# Patient Record
Sex: Male | Born: 1970
Health system: Southern US, Community
[De-identification: ages and names within clinical notes are randomized; demographics above are authoritative.]

## PROBLEM LIST (undated history)

## (undated) DIAGNOSIS — I10 Essential (primary) hypertension: Secondary | ICD-10-CM

## (undated) DIAGNOSIS — D510 Vitamin B12 deficiency anemia due to intrinsic factor deficiency: Secondary | ICD-10-CM

## (undated) DIAGNOSIS — R269 Unspecified abnormalities of gait and mobility: Secondary | ICD-10-CM

## (undated) HISTORY — DX: Unspecified abnormalities of gait and mobility: R26.9

## (undated) HISTORY — PX: CHOLECYSTECTOMY: SHX55

## (undated) HISTORY — PX: TOE AMPUTATION: SHX809

## (undated) HISTORY — DX: Vitamin B12 deficiency anemia due to intrinsic factor deficiency: D51.0

---

## 1999-03-08 ENCOUNTER — Emergency Department (HOSPITAL_COMMUNITY): Admission: EM | Admit: 1999-03-08 | Discharge: 1999-03-08 | Payer: Self-pay

## 1999-10-09 ENCOUNTER — Encounter: Payer: Self-pay | Admitting: Internal Medicine

## 1999-10-09 ENCOUNTER — Inpatient Hospital Stay (HOSPITAL_COMMUNITY): Admission: EM | Admit: 1999-10-09 | Discharge: 1999-10-10 | Payer: Self-pay | Admitting: Emergency Medicine

## 2001-06-04 ENCOUNTER — Inpatient Hospital Stay (HOSPITAL_COMMUNITY): Admission: EM | Admit: 2001-06-04 | Discharge: 2001-06-11 | Payer: Self-pay | Admitting: *Deleted

## 2001-06-04 ENCOUNTER — Encounter (INDEPENDENT_AMBULATORY_CARE_PROVIDER_SITE_OTHER): Payer: Self-pay | Admitting: Specialist

## 2001-06-04 ENCOUNTER — Encounter: Payer: Self-pay | Admitting: Internal Medicine

## 2001-06-06 ENCOUNTER — Encounter: Payer: Self-pay | Admitting: Internal Medicine

## 2001-06-18 ENCOUNTER — Encounter: Admission: RE | Admit: 2001-06-18 | Discharge: 2001-06-26 | Payer: Self-pay | Admitting: Internal Medicine

## 2001-07-17 ENCOUNTER — Encounter (HOSPITAL_BASED_OUTPATIENT_CLINIC_OR_DEPARTMENT_OTHER): Admission: RE | Admit: 2001-07-17 | Discharge: 2001-08-30 | Payer: Self-pay | Admitting: Orthopedic Surgery

## 2001-09-09 ENCOUNTER — Emergency Department (HOSPITAL_COMMUNITY): Admission: EM | Admit: 2001-09-09 | Discharge: 2001-09-09 | Payer: Self-pay | Admitting: *Deleted

## 2003-01-22 ENCOUNTER — Emergency Department (HOSPITAL_COMMUNITY): Admission: EM | Admit: 2003-01-22 | Discharge: 2003-01-22 | Payer: Self-pay | Admitting: Emergency Medicine

## 2003-08-14 ENCOUNTER — Emergency Department (HOSPITAL_COMMUNITY): Admission: EM | Admit: 2003-08-14 | Discharge: 2003-08-14 | Payer: Self-pay | Admitting: Family Medicine

## 2003-11-15 ENCOUNTER — Emergency Department (HOSPITAL_COMMUNITY): Admission: EM | Admit: 2003-11-15 | Discharge: 2003-11-15 | Payer: Self-pay | Admitting: Emergency Medicine

## 2003-11-20 ENCOUNTER — Emergency Department (HOSPITAL_COMMUNITY): Admission: EM | Admit: 2003-11-20 | Discharge: 2003-11-20 | Payer: Self-pay | Admitting: Emergency Medicine

## 2003-11-23 ENCOUNTER — Inpatient Hospital Stay (HOSPITAL_COMMUNITY): Admission: EM | Admit: 2003-11-23 | Discharge: 2003-12-01 | Payer: Self-pay

## 2003-11-24 ENCOUNTER — Encounter (INDEPENDENT_AMBULATORY_CARE_PROVIDER_SITE_OTHER): Payer: Self-pay | Admitting: *Deleted

## 2003-12-08 ENCOUNTER — Encounter (HOSPITAL_BASED_OUTPATIENT_CLINIC_OR_DEPARTMENT_OTHER): Admission: RE | Admit: 2003-12-08 | Discharge: 2004-03-07 | Payer: Self-pay | Admitting: Internal Medicine

## 2003-12-17 ENCOUNTER — Encounter: Admission: RE | Admit: 2003-12-17 | Discharge: 2003-12-17 | Payer: Self-pay | Admitting: Orthopedic Surgery

## 2004-01-15 ENCOUNTER — Ambulatory Visit (HOSPITAL_COMMUNITY): Admission: RE | Admit: 2004-01-15 | Discharge: 2004-01-15 | Payer: Self-pay | Admitting: Family Medicine

## 2004-01-15 ENCOUNTER — Emergency Department (HOSPITAL_COMMUNITY): Admission: EM | Admit: 2004-01-15 | Discharge: 2004-01-15 | Payer: Self-pay | Admitting: Family Medicine

## 2004-01-21 ENCOUNTER — Inpatient Hospital Stay (HOSPITAL_COMMUNITY): Admission: RE | Admit: 2004-01-21 | Discharge: 2004-01-23 | Payer: Self-pay | Admitting: Orthopedic Surgery

## 2004-01-21 ENCOUNTER — Encounter (INDEPENDENT_AMBULATORY_CARE_PROVIDER_SITE_OTHER): Payer: Self-pay | Admitting: Specialist

## 2004-05-13 ENCOUNTER — Ambulatory Visit (HOSPITAL_COMMUNITY): Admission: RE | Admit: 2004-05-13 | Discharge: 2004-05-13 | Payer: Self-pay | Admitting: Family Medicine

## 2004-05-24 ENCOUNTER — Encounter: Admission: RE | Admit: 2004-05-24 | Discharge: 2004-05-24 | Payer: Self-pay | Admitting: Gastroenterology

## 2004-09-23 ENCOUNTER — Encounter: Admission: RE | Admit: 2004-09-23 | Discharge: 2004-09-23 | Payer: Self-pay | Admitting: Occupational Medicine

## 2004-11-16 ENCOUNTER — Inpatient Hospital Stay (HOSPITAL_COMMUNITY): Admission: EM | Admit: 2004-11-16 | Discharge: 2004-11-20 | Payer: Self-pay | Admitting: Emergency Medicine

## 2005-07-12 ENCOUNTER — Emergency Department (HOSPITAL_COMMUNITY): Admission: EM | Admit: 2005-07-12 | Discharge: 2005-07-12 | Payer: Self-pay | Admitting: Emergency Medicine

## 2005-07-14 ENCOUNTER — Ambulatory Visit (HOSPITAL_COMMUNITY): Admission: RE | Admit: 2005-07-14 | Discharge: 2005-07-14 | Payer: Self-pay | Admitting: Orthopaedic Surgery

## 2005-07-14 ENCOUNTER — Encounter (INDEPENDENT_AMBULATORY_CARE_PROVIDER_SITE_OTHER): Payer: Self-pay | Admitting: *Deleted

## 2006-01-11 ENCOUNTER — Emergency Department (HOSPITAL_COMMUNITY): Admission: EM | Admit: 2006-01-11 | Discharge: 2006-01-11 | Payer: Self-pay | Admitting: Emergency Medicine

## 2006-01-14 ENCOUNTER — Emergency Department (HOSPITAL_COMMUNITY): Admission: EM | Admit: 2006-01-14 | Discharge: 2006-01-14 | Payer: Self-pay | Admitting: Emergency Medicine

## 2006-10-19 ENCOUNTER — Emergency Department (HOSPITAL_COMMUNITY): Admission: EM | Admit: 2006-10-19 | Discharge: 2006-10-20 | Payer: Self-pay | Admitting: Emergency Medicine

## 2007-07-19 ENCOUNTER — Ambulatory Visit: Payer: Self-pay | Admitting: Family Medicine

## 2007-07-19 ENCOUNTER — Inpatient Hospital Stay (HOSPITAL_COMMUNITY): Admission: AD | Admit: 2007-07-19 | Discharge: 2007-07-25 | Payer: Self-pay | Admitting: Family Medicine

## 2007-07-23 ENCOUNTER — Encounter (INDEPENDENT_AMBULATORY_CARE_PROVIDER_SITE_OTHER): Payer: Self-pay | Admitting: General Surgery

## 2007-11-14 ENCOUNTER — Inpatient Hospital Stay (HOSPITAL_COMMUNITY): Admission: EM | Admit: 2007-11-14 | Discharge: 2007-11-18 | Payer: Self-pay | Admitting: Emergency Medicine

## 2007-11-26 ENCOUNTER — Ambulatory Visit: Payer: Self-pay | Admitting: *Deleted

## 2008-06-29 ENCOUNTER — Emergency Department (HOSPITAL_COMMUNITY): Admission: EM | Admit: 2008-06-29 | Discharge: 2008-06-29 | Payer: Self-pay | Admitting: Emergency Medicine

## 2008-09-19 ENCOUNTER — Inpatient Hospital Stay (HOSPITAL_COMMUNITY): Admission: EM | Admit: 2008-09-19 | Discharge: 2008-09-23 | Payer: Self-pay | Admitting: Emergency Medicine

## 2008-11-18 ENCOUNTER — Ambulatory Visit (HOSPITAL_COMMUNITY): Admission: RE | Admit: 2008-11-18 | Discharge: 2008-11-19 | Payer: Self-pay | Admitting: Orthopedic Surgery

## 2008-12-16 ENCOUNTER — Inpatient Hospital Stay (HOSPITAL_COMMUNITY): Admission: EM | Admit: 2008-12-16 | Discharge: 2008-12-19 | Payer: Self-pay | Admitting: Emergency Medicine

## 2008-12-18 ENCOUNTER — Ambulatory Visit: Payer: Self-pay | Admitting: Cardiology

## 2008-12-18 ENCOUNTER — Encounter: Payer: Self-pay | Admitting: Cardiology

## 2009-03-10 ENCOUNTER — Emergency Department (HOSPITAL_COMMUNITY): Admission: EM | Admit: 2009-03-10 | Discharge: 2009-03-10 | Payer: Self-pay | Admitting: Emergency Medicine

## 2009-03-22 ENCOUNTER — Emergency Department (HOSPITAL_COMMUNITY): Admission: EM | Admit: 2009-03-22 | Discharge: 2009-03-23 | Payer: Self-pay | Admitting: Emergency Medicine

## 2009-04-29 ENCOUNTER — Emergency Department (HOSPITAL_COMMUNITY): Admission: EM | Admit: 2009-04-29 | Discharge: 2009-04-29 | Payer: Self-pay | Admitting: Emergency Medicine

## 2009-05-02 ENCOUNTER — Emergency Department (HOSPITAL_COMMUNITY): Admission: EM | Admit: 2009-05-02 | Discharge: 2009-05-02 | Payer: Self-pay | Admitting: Emergency Medicine

## 2009-06-14 ENCOUNTER — Emergency Department (HOSPITAL_COMMUNITY): Admission: EM | Admit: 2009-06-14 | Discharge: 2009-06-14 | Payer: Self-pay | Admitting: Emergency Medicine

## 2009-07-31 ENCOUNTER — Ambulatory Visit: Payer: Self-pay | Admitting: Cardiology

## 2009-07-31 ENCOUNTER — Inpatient Hospital Stay (HOSPITAL_COMMUNITY)
Admission: EM | Admit: 2009-07-31 | Discharge: 2009-08-07 | Payer: Self-pay | Source: Home / Self Care | Admitting: Emergency Medicine

## 2009-08-01 ENCOUNTER — Encounter (INDEPENDENT_AMBULATORY_CARE_PROVIDER_SITE_OTHER): Payer: Self-pay | Admitting: Internal Medicine

## 2009-08-02 ENCOUNTER — Ambulatory Visit: Payer: Self-pay | Admitting: Infectious Disease

## 2009-08-06 ENCOUNTER — Encounter (INDEPENDENT_AMBULATORY_CARE_PROVIDER_SITE_OTHER): Payer: Self-pay | Admitting: Internal Medicine

## 2009-08-10 ENCOUNTER — Encounter: Payer: Self-pay | Admitting: Internal Medicine

## 2009-08-11 ENCOUNTER — Encounter: Payer: Self-pay | Admitting: Infectious Diseases

## 2009-08-12 ENCOUNTER — Emergency Department (HOSPITAL_COMMUNITY): Admission: EM | Admit: 2009-08-12 | Discharge: 2009-08-12 | Payer: Self-pay | Admitting: Emergency Medicine

## 2009-08-18 ENCOUNTER — Encounter: Payer: Self-pay | Admitting: Infectious Diseases

## 2009-08-19 ENCOUNTER — Encounter: Payer: Self-pay | Admitting: Infectious Diseases

## 2009-08-19 DIAGNOSIS — E109 Type 1 diabetes mellitus without complications: Secondary | ICD-10-CM | POA: Insufficient documentation

## 2009-08-19 DIAGNOSIS — I10 Essential (primary) hypertension: Secondary | ICD-10-CM

## 2009-08-19 DIAGNOSIS — M869 Osteomyelitis, unspecified: Secondary | ICD-10-CM | POA: Insufficient documentation

## 2009-08-19 HISTORY — DX: Osteomyelitis, unspecified: M86.9

## 2009-08-19 HISTORY — DX: Type 1 diabetes mellitus without complications: E10.9

## 2009-08-19 HISTORY — DX: Essential (primary) hypertension: I10

## 2009-08-24 ENCOUNTER — Encounter: Payer: Self-pay | Admitting: Infectious Diseases

## 2009-08-31 ENCOUNTER — Encounter: Payer: Self-pay | Admitting: Infectious Diseases

## 2009-09-07 ENCOUNTER — Encounter: Payer: Self-pay | Admitting: Infectious Diseases

## 2009-09-18 ENCOUNTER — Encounter: Payer: Self-pay | Admitting: Infectious Diseases

## 2010-05-05 ENCOUNTER — Observation Stay (HOSPITAL_COMMUNITY)
Admission: EM | Admit: 2010-05-05 | Discharge: 2010-05-05 | Payer: Self-pay | Source: Home / Self Care | Admitting: Emergency Medicine

## 2010-05-05 LAB — POCT I-STAT, CHEM 8
BUN: 26 mg/dL — ABNORMAL HIGH (ref 6–23)
Calcium, Ion: 1.12 mmol/L (ref 1.12–1.32)
Chloride: 98 mEq/L (ref 96–112)
Creatinine, Ser: 1.9 mg/dL — ABNORMAL HIGH (ref 0.4–1.5)
Glucose, Bld: >700 mg/dL (ref 70–99)

## 2010-05-05 LAB — URINALYSIS, ROUTINE W REFLEX MICROSCOPIC
Bilirubin Urine: NEGATIVE
Specific Gravity, Urine: 1.03 (ref 1.005–1.030)
Urobilinogen, UA: 0.2 mg/dL (ref 0.0–1.0)
pH: 5.5 (ref 5.0–8.0)

## 2010-05-05 LAB — GLUCOSE, CAPILLARY
Glucose-Capillary: >600 mg/dL (ref 70–99)
Glucose-Capillary: >600 mg/dL (ref 70–99)
Glucose-Capillary: 491 mg/dL — ABNORMAL HIGH (ref 70–99)

## 2010-05-05 LAB — CBC
HCT: 39.3 % (ref 39.0–52.0)
Hemoglobin: 13.7 g/dL (ref 13.0–17.0)
MCV: 77.7 fL — ABNORMAL LOW (ref 78.0–100.0)
RBC: 5.06 MIL/uL (ref 4.22–5.81)
WBC: 9.9 10*3/uL (ref 4.0–10.5)

## 2010-05-05 LAB — COMPREHENSIVE METABOLIC PANEL
Albumin: 3.7 g/dL (ref 3.5–5.2)
Alkaline Phosphatase: 134 U/L — ABNORMAL HIGH (ref 39–117)
BUN: 22 mg/dL (ref 6–23)
Chloride: 92 mEq/L — ABNORMAL LOW (ref 96–112)
Potassium: 4.9 mEq/L (ref 3.5–5.1)
Total Bilirubin: 0.8 mg/dL (ref 0.3–1.2)

## 2010-05-05 LAB — URINE MICROSCOPIC-ADD ON

## 2010-05-05 LAB — DIFFERENTIAL
Lymphocytes Relative: 13 % (ref 12–46)
Lymphs Abs: 1.3 10*3/uL (ref 0.7–4.0)
Neutrophils Relative %: 84 % — ABNORMAL HIGH (ref 43–77)

## 2010-05-11 NOTE — Medication Information (Signed)
Summary: [facility]:Medication  [facility]:Medication   Imported By: Florinda Marker 08/20/2009 11:25:27  _____________________________________________________________________  External Attachment:    Type:   Image     Comment:   External Document

## 2010-05-11 NOTE — Miscellaneous (Signed)
Summary: [facility]: Orders  [facility]: Orders   Imported By: Florinda Marker 09/25/2009 10:20:33  _____________________________________________________________________  External Attachment:    Type:   Image     Comment:   External Document

## 2010-05-11 NOTE — Miscellaneous (Signed)
Summary: [facility] Health Cert[facility] & Plan Of Care  [facility]: [facility] Health Cert[facility] & Plan Of Care   Imported By: Florinda Marker 08/27/2009 10:20:12  _____________________________________________________________________  External Attachment:    Type:   Image     Comment:   External Document

## 2010-06-06 ENCOUNTER — Inpatient Hospital Stay (HOSPITAL_COMMUNITY)
Admission: EM | Admit: 2010-06-06 | Discharge: 2010-06-14 | DRG: 617 | Disposition: A | Discharge status: Home / Self Care | Payer: Medicare Other | Attending: Internal Medicine | Admitting: Internal Medicine

## 2010-06-06 ENCOUNTER — Emergency Department (HOSPITAL_COMMUNITY): Payer: Medicare Other

## 2010-06-06 DIAGNOSIS — R000 Tachycardia, unspecified: Secondary | ICD-10-CM | POA: Diagnosis present

## 2010-06-06 DIAGNOSIS — D649 Anemia, unspecified: Secondary | ICD-10-CM | POA: Diagnosis present

## 2010-06-06 DIAGNOSIS — M109 Gout, unspecified: Secondary | ICD-10-CM | POA: Diagnosis present

## 2010-06-06 DIAGNOSIS — M9080 Osteopathy in diseases classified elsewhere, unspecified site: Secondary | ICD-10-CM | POA: Diagnosis present

## 2010-06-06 DIAGNOSIS — I129 Hypertensive chronic kidney disease with stage 1 through stage 4 chronic kidney disease, or unspecified chronic kidney disease: Secondary | ICD-10-CM | POA: Diagnosis present

## 2010-06-06 DIAGNOSIS — A5211 Tabes dorsalis: Secondary | ICD-10-CM | POA: Diagnosis present

## 2010-06-06 DIAGNOSIS — N179 Acute kidney failure, unspecified: Secondary | ICD-10-CM | POA: Diagnosis present

## 2010-06-06 DIAGNOSIS — N183 Chronic kidney disease, stage 3 unspecified: Secondary | ICD-10-CM | POA: Diagnosis present

## 2010-06-06 DIAGNOSIS — Z794 Long term (current) use of insulin: Secondary | ICD-10-CM

## 2010-06-06 DIAGNOSIS — Z91199 Patient's noncompliance with other medical treatment and regimen due to unspecified reason: Secondary | ICD-10-CM

## 2010-06-06 DIAGNOSIS — L988 Other specified disorders of the skin and subcutaneous tissue: Secondary | ICD-10-CM | POA: Diagnosis present

## 2010-06-06 DIAGNOSIS — E1165 Type 2 diabetes mellitus with hyperglycemia: Principal | ICD-10-CM | POA: Diagnosis present

## 2010-06-06 DIAGNOSIS — I739 Peripheral vascular disease, unspecified: Secondary | ICD-10-CM | POA: Diagnosis present

## 2010-06-06 DIAGNOSIS — S98139A Complete traumatic amputation of one unspecified lesser toe, initial encounter: Secondary | ICD-10-CM

## 2010-06-06 DIAGNOSIS — IMO0002 Reserved for concepts with insufficient information to code with codable children: Principal | ICD-10-CM | POA: Diagnosis present

## 2010-06-06 DIAGNOSIS — B951 Streptococcus, group B, as the cause of diseases classified elsewhere: Secondary | ICD-10-CM | POA: Diagnosis present

## 2010-06-06 DIAGNOSIS — M86679 Other chronic osteomyelitis, unspecified ankle and foot: Secondary | ICD-10-CM | POA: Diagnosis present

## 2010-06-06 DIAGNOSIS — Z9119 Patient's noncompliance with other medical treatment and regimen: Secondary | ICD-10-CM

## 2010-06-06 DIAGNOSIS — G609 Hereditary and idiopathic neuropathy, unspecified: Secondary | ICD-10-CM | POA: Diagnosis present

## 2010-06-06 LAB — BASIC METABOLIC PANEL
Calcium: 8.6 mg/dL (ref 8.4–10.5)
GFR calc Af Amer: >60 mL/min (ref >60)
GFR calc non Af Amer: 57 mL/min — ABNORMAL LOW (ref >60)
Potassium: 4.6 mEq/L (ref 3.5–5.1)
Sodium: 135 mEq/L (ref 135–145)

## 2010-06-06 LAB — DIFFERENTIAL
Basophils Absolute: 0 10*3/uL (ref 0.0–0.1)
Basophils Relative: 0 % (ref 0–1)
Eosinophils Absolute: 0.2 10*3/uL (ref 0.0–0.7)
Eosinophils Relative: 2 % (ref 0–5)
Lymphs Abs: 2.3 10*3/uL (ref 0.7–4.0)
Neutrophils Relative %: 73 % (ref 43–77)

## 2010-06-06 LAB — GLUCOSE, CAPILLARY: Glucose-Capillary: 155 mg/dL — ABNORMAL HIGH (ref 70–99)

## 2010-06-06 LAB — CBC
Hemoglobin: 12.9 g/dL — ABNORMAL LOW (ref 13.0–17.0)
Platelets: 222 10*3/uL (ref 150–400)
RBC: 4.72 MIL/uL (ref 4.22–5.81)
RDW: 13.5 % (ref 11.5–15.5)
WBC: 12.4 10*3/uL — ABNORMAL HIGH (ref 4.0–10.5)

## 2010-06-07 ENCOUNTER — Inpatient Hospital Stay (HOSPITAL_COMMUNITY): Payer: Medicare Other

## 2010-06-07 LAB — COMPREHENSIVE METABOLIC PANEL
Albumin: 2.5 g/dL — ABNORMAL LOW (ref 3.5–5.2)
Alkaline Phosphatase: 89 U/L (ref 39–117)
BUN: 16 mg/dL (ref 6–23)
CO2: 27 mEq/L (ref 19–32)
Chloride: 105 mEq/L (ref 96–112)
GFR calc non Af Amer: 58 mL/min — ABNORMAL LOW (ref >60)
Glucose, Bld: 310 mg/dL — ABNORMAL HIGH (ref 70–99)
Potassium: 4.7 mEq/L (ref 3.5–5.1)
Total Bilirubin: 0.7 mg/dL (ref 0.3–1.2)

## 2010-06-07 LAB — CBC
HCT: 35.7 % — ABNORMAL LOW (ref 39.0–52.0)
Hemoglobin: 11.5 g/dL — ABNORMAL LOW (ref 13.0–17.0)
MCV: 82.6 fL (ref 78.0–100.0)
Platelets: 203 10*3/uL (ref 150–400)
RBC: 4.32 MIL/uL (ref 4.22–5.81)
WBC: 12.1 10*3/uL — ABNORMAL HIGH (ref 4.0–10.5)

## 2010-06-07 LAB — LIPID PANEL
HDL: 51 mg/dL (ref >39)
Triglycerides: 122 mg/dL (ref <150)
VLDL: 24 mg/dL (ref 0–40)

## 2010-06-07 LAB — GLUCOSE, CAPILLARY: Glucose-Capillary: 264 mg/dL — ABNORMAL HIGH (ref 70–99)

## 2010-06-07 LAB — MRSA PCR SCREENING: MRSA by PCR: NEGATIVE

## 2010-06-07 MED ORDER — GADOBENATE DIMEGLUMINE 529 MG/ML IV SOLN
20.0000 mL | Freq: Once | INTRAVENOUS | Status: AC | PRN
  Administered 2010-06-07: 20 mL via INTRAVENOUS

## 2010-06-08 LAB — GLUCOSE, CAPILLARY
Glucose-Capillary: 302 mg/dL — ABNORMAL HIGH (ref 70–99)
Glucose-Capillary: 222 mg/dL — ABNORMAL HIGH (ref 70–99)

## 2010-06-08 LAB — BASIC METABOLIC PANEL
BUN: 16 mg/dL (ref 6–23)
CO2: 27 mEq/L (ref 19–32)
Chloride: 105 mEq/L (ref 96–112)
Creatinine, Ser: 1.54 mg/dL — ABNORMAL HIGH (ref 0.4–1.5)
Glucose, Bld: 320 mg/dL — ABNORMAL HIGH (ref 70–99)

## 2010-06-08 LAB — WOUND CULTURE

## 2010-06-08 LAB — CBC
Hemoglobin: 11.4 g/dL — ABNORMAL LOW (ref 13.0–17.0)
MCH: 27.1 pg (ref 26.0–34.0)
MCV: 83.4 fL (ref 78.0–100.0)
RBC: 4.21 MIL/uL — ABNORMAL LOW (ref 4.22–5.81)

## 2010-06-08 LAB — DIFFERENTIAL
Lymphs Abs: 2.8 10*3/uL (ref 0.7–4.0)
Monocytes Relative: 5 % (ref 3–12)
Neutro Abs: 4.4 10*3/uL (ref 1.7–7.7)
Neutrophils Relative %: 56 % (ref 43–77)

## 2010-06-09 DIAGNOSIS — L0889 Other specified local infections of the skin and subcutaneous tissue: Secondary | ICD-10-CM

## 2010-06-09 DIAGNOSIS — B951 Streptococcus, group B, as the cause of diseases classified elsewhere: Secondary | ICD-10-CM

## 2010-06-09 LAB — CBC
Hemoglobin: 11.4 g/dL — ABNORMAL LOW (ref 13.0–17.0)
MCH: 27.1 pg (ref 26.0–34.0)
MCHC: 32.6 g/dL (ref 30.0–36.0)

## 2010-06-09 LAB — GLUCOSE, CAPILLARY

## 2010-06-09 LAB — BASIC METABOLIC PANEL
BUN: 18 mg/dL (ref 6–23)
CO2: 26 mEq/L (ref 19–32)
Calcium: 8.7 mg/dL (ref 8.4–10.5)
Creatinine, Ser: 1.47 mg/dL (ref 0.4–1.5)
GFR calc Af Amer: >60 mL/min (ref >60)

## 2010-06-09 LAB — DIFFERENTIAL
Basophils Relative: 0 % (ref 0–1)
Eosinophils Absolute: 0.2 10*3/uL (ref 0.0–0.7)
Monocytes Absolute: 0.6 10*3/uL (ref 0.1–1.0)
Monocytes Relative: 6 % (ref 3–12)
Neutro Abs: 5.9 10*3/uL (ref 1.7–7.7)

## 2010-06-10 LAB — GLUCOSE, CAPILLARY
Glucose-Capillary: 232 mg/dL — ABNORMAL HIGH (ref 70–99)
Glucose-Capillary: 275 mg/dL — ABNORMAL HIGH (ref 70–99)
Glucose-Capillary: 153 mg/dL — ABNORMAL HIGH (ref 70–99)
Glucose-Capillary: 145 mg/dL — ABNORMAL HIGH (ref 70–99)

## 2010-06-10 LAB — BASIC METABOLIC PANEL
Calcium: 8.6 mg/dL (ref 8.4–10.5)
GFR calc Af Amer: >60 mL/min (ref >60)
GFR calc non Af Amer: 55 mL/min — ABNORMAL LOW (ref >60)
Glucose, Bld: 256 mg/dL — ABNORMAL HIGH (ref 70–99)
Potassium: 4.6 mEq/L (ref 3.5–5.1)
Sodium: 136 mEq/L (ref 135–145)

## 2010-06-10 LAB — CBC
HCT: 35.9 % — ABNORMAL LOW (ref 39.0–52.0)
MCHC: 32.9 g/dL (ref 30.0–36.0)
Platelets: 220 10*3/uL (ref 150–400)
RDW: 13.3 % (ref 11.5–15.5)
WBC: 7.2 10*3/uL (ref 4.0–10.5)

## 2010-06-11 LAB — BASIC METABOLIC PANEL
CO2: 26 mEq/L (ref 19–32)
Calcium: 8.9 mg/dL (ref 8.4–10.5)
Chloride: 106 mEq/L (ref 96–112)
Creatinine, Ser: 1.17 mg/dL (ref 0.4–1.5)
GFR calc Af Amer: >60 mL/min (ref >60)
Sodium: 139 mEq/L (ref 135–145)

## 2010-06-11 LAB — CBC
HCT: 36.9 % — ABNORMAL LOW (ref 39.0–52.0)
MCV: 82.6 fL (ref 78.0–100.0)
RBC: 4.47 MIL/uL (ref 4.22–5.81)
RDW: 13.3 % (ref 11.5–15.5)
WBC: 7.5 10*3/uL (ref 4.0–10.5)

## 2010-06-11 LAB — GLUCOSE, CAPILLARY
Glucose-Capillary: 80 mg/dL (ref 70–99)
Glucose-Capillary: 84 mg/dL (ref 70–99)

## 2010-06-11 LAB — PROTIME-INR: INR: 1.15 (ref 0.00–1.49)

## 2010-06-12 LAB — CBC
HCT: 35.3 % — ABNORMAL LOW (ref 39.0–52.0)
Hemoglobin: 11.6 g/dL — ABNORMAL LOW (ref 13.0–17.0)
MCH: 27 pg (ref 26.0–34.0)
MCHC: 32.9 g/dL (ref 30.0–36.0)
RDW: 13.6 % (ref 11.5–15.5)

## 2010-06-12 LAB — BASIC METABOLIC PANEL
CO2: 26 mEq/L (ref 19–32)
Calcium: 8.6 mg/dL (ref 8.4–10.5)
Creatinine, Ser: 1.19 mg/dL (ref 0.4–1.5)
GFR calc non Af Amer: >60 mL/min (ref >60)
Glucose, Bld: 152 mg/dL — ABNORMAL HIGH (ref 70–99)

## 2010-06-12 LAB — GLUCOSE, CAPILLARY
Glucose-Capillary: 119 mg/dL — ABNORMAL HIGH (ref 70–99)
Glucose-Capillary: 130 mg/dL — ABNORMAL HIGH (ref 70–99)

## 2010-06-12 NOTE — H&P (Signed)
[name], [name]                ACCOUNT NO.:  [id number]  MEDICAL RECORD NO.:  [id number]           PATIENT TYPE:  I  LOCATION:  1609                         FACILITY:  [facility]  PHYSICIAN:  Arne Cleveland, MD       DATE OF BIRTH:  [birth year]  DATE OF ADMISSION:  06/06/2010 DATE OF DISCHARGE:                             HISTORY & PHYSICAL   PRIMARY CARE PHYSICIAN:  Unassigned.  ORTHOPEDIST:  Nadara Mustard, MD  CHIEF COMPLAINT:  Left foot pain.  HISTORY OF PRESENT ILLNESS:  [name] is a 40 year old black male with a history of developing a blister on the stump of his left great toe on his left foot about 2 weeks ago.  He did not come in and seek treatment because he thought it was getting better, but 2 days ago he started having a lot of pain and then yesterday it opened up and started draining, so he came in today.  The drainage was foul smelling and he was having a lot of foot pain.  He has got concerned because he has had multiple amputations, so he came in.  PAST MEDICAL HISTORY:  Significant for: 1. Diabetes. 2. Hypertension. 3. Chronic kidney disease stage 2-3. 4. Peripheral neuropathy. 5. Gout. 6. Peripheral vascular disease. 7. Osteomyelitis. 8. Charcot joint on the left foot.  PAST SURGICAL HISTORY:  He has had lap chole.  He has had amputations of the 2nd, 4th, 5th, and great toe on the left foot.  He has had excision of the left cuboid bone on the left foot.  He has had an amputation of the great toe on the right foot.  SOCIAL HISTORY:  He is disabled.  He does not smoke, drink, or use illicit drugs.  FAMILY HISTORY:  Significant for diabetes.  Mother, grandmother, and brother all have diabetes, father does not.  REVIEW OF SYSTEMS:  Only as per history of present illness.  He denied having any fever, chills, night sweats, nausea, vomiting or diarrhea, any rash other than the ulcer on the stump of his left foot, the stump of the left great  toe.  LABORATORY DATA:  His basic metabolic panel was normal except for glucose of 220 and his GFR was low at 57.  White count was 12,400, hemoglobin 12.9, hematocrit 39.2 with left shift.  The wound was cultured and that is pending.  X-ray of the left foot showed osteolysis involving multiple bones in the midfoot.  Differential diagnosis includes acute osteomyelitis, neuropathy, arthropathy, or Charcot joint.  PHYSICAL EXAMINATION:  GENERAL:  He is a well-developed, well-nourished, mildly obese black male in no acute distress. VITAL SIGNS:  Blood pressure 176/26, pulse 105, respirations 20, temperature 97.7, pulse ox 98%. HEENT:  Examination of his head is atraumatic, normocephalic.  Eyes: Pupils are equal, round, reactive to light.  Discs sharp.  Extraocular muscle range of motion is full.  Ear, nose, and throat appear normal. Mucous membranes are moist. NECK:  Supple without jugular venous distention, thyromegaly, or thyroid mass. CHEST:  Nontender. LUNGS:  Clear to auscultation and percussion. HEART:  Regular rhythm and rate.  Normal S1, S2 without murmur, gallop, or rub. ABDOMEN:  Soft, nontender.  Normoactive bowel sounds.  No hepatomegaly. No splenomegaly.  No palpable mass. RECTAL EXAM:  Deferred at the patient's request. NEURO EXAM:  Awake, alert, oriented x4.  Cranial nerves II through XII intact.  Motor strength is 5/5 in all extremities.  Sensory, he has decreased sensation in the lower extremities, but he withdraws to painful stimuli. SKIN:  There is no unusual rash or lesions. LYMPH NODES:  Normal.  IMPRESSION:  Diabetic foot ulcer involving the stump on the left foot of the great toe, left foot cellulitis, possible osteomyelitis, hypertension uncontrolled, noncompliance.  PLAN:  To admit to [facility], regular bed.  Start him on IV antibiotics with vanc and Zosyn.  Continue control blood pressure, resume meds.  Do Accu-Cheks a.c. and h.s. and hospital  sliding scale. Obtain an MRI of the foot to rule out osteo; if positive, obtain ortho consult.  Check on culture.  Further evaluation and treatment as indicated by hospital course.     Arne Cleveland, MD     ML/MEDQ  D:  06/06/2010  T:  06/06/2010  Job:  [id number]  Electronically Signed by Arne Cleveland  on 06/12/2010 06:33:43 AM

## 2010-06-13 LAB — BASIC METABOLIC PANEL
BUN: 19 mg/dL (ref 6–23)
GFR calc Af Amer: >60 mL/min (ref >60)
GFR calc non Af Amer: >60 mL/min (ref >60)
Potassium: 4.1 mEq/L (ref 3.5–5.1)
Sodium: 137 mEq/L (ref 135–145)

## 2010-06-13 LAB — CBC
Platelets: 209 10*3/uL (ref 150–400)
RDW: 13.5 % (ref 11.5–15.5)
WBC: 8.3 10*3/uL (ref 4.0–10.5)

## 2010-06-13 LAB — GLUCOSE, CAPILLARY
Glucose-Capillary: 121 mg/dL — ABNORMAL HIGH (ref 70–99)
Glucose-Capillary: 114 mg/dL — ABNORMAL HIGH (ref 70–99)

## 2010-06-14 LAB — CBC
HCT: 33.2 % — ABNORMAL LOW (ref 39.0–52.0)
Hemoglobin: 10.6 g/dL — ABNORMAL LOW (ref 13.0–17.0)
MCH: 26.5 pg (ref 26.0–34.0)
MCV: 83 fL (ref 78.0–100.0)
RBC: 4 MIL/uL — ABNORMAL LOW (ref 4.22–5.81)

## 2010-06-14 LAB — BASIC METABOLIC PANEL
BUN: 19 mg/dL (ref 6–23)
CO2: 25 mEq/L (ref 19–32)
Chloride: 106 mEq/L (ref 96–112)
Creatinine, Ser: 1.28 mg/dL (ref 0.4–1.5)

## 2010-06-17 NOTE — Discharge Summary (Signed)
[name], [name]                ACCOUNT NO.:  [id number]  MEDICAL RECORD NO.:  [id number]           PATIENT TYPE:  I  LOCATION:  1609                         FACILITY:  [facility]  PHYSICIAN:  Mikhaila Roh I Layanna Charo, MD      DATE OF BIRTH:  [birth year]  DATE OF ADMISSION:  06/06/2010 DATE OF DISCHARGE:  06/14/2010                              DISCHARGE SUMMARY   PRIMARY CARE PHYSICIAN:  The patient will see Dr. Lajoyce Corners, will see Dr. Lovell Sheehan.  ORTHOPEDIST:  Nadara Mustard, MD  DISCHARGE DIAGNOSES: 1. Chronic osteomyelitis with Wagner grade 3 ulcer of the left great     toe, status post left first ray amputation on May 2nd, it could be     secondary to uncontrolled diabetes versus uncontrolled gout. 2. Uncontrolled diabetes mellitus with hemoglobin A1c of 15.6. 3. Hypertension. 4. Gout. 5. Peripheral vascular disease. 6. Charcot joint on the left foot. 7. Status post amputation of 2nd, 4th, 5th great toe on the left foot.  DISCHARGE MEDICATIONS: 1. Norvasc 10 mg p.o. daily. 2. Augmentin 875 mg p.o. t.i.d. 3. Aspirin 81 mg p.o. daily. 4. Insulin Lantus 40 units subcutaneous nightly and NovoLog 15 units     subcutaneous 3 times daily. 5. Lopressor 25 mg p.o. b.i.d. 6. Colchicine 0.6 mg p.o. twice daily. 7. Allopurinol 300 mg p.o. daily.  CONSULTATIONS:  Nadara Mustard, MD, and Infectious Disease.  HOSPITAL COURSE: 1. Chronic osteomyelitis with Wagner grade 3 ulcer of the left great     toe, status post left foot first ray amputation.  MRI was ordered,     the results of which did show osteomyelitis of the proximal     pharyngeal bone of the great toe, probably neuropathic changes in     the mid and lateral cuneiform and cuboid.  Abnormal edema     enhancement of the talus with ankle joint effusion.  This could be     due to Charcot joint, but infection could give exactly the same     appearance.  Wound culture grew abundant group B Streptococcal     agalactiae.  The patient was seen in  consultation by Dr. Lajoyce Corners and     Dr. Maurice March consulted.  The patient planned to continue his Unasyn for     his amputation.  It was felt that  he had chronic osteomyelitis and     the patient would benefit from high-dose Augmentin for 6 weeks.     His white blood cell remained normal and remained afebrile.  The     patient will be discharged with 6 weeks of oral Augmentin. 2. Hypertension.  The patient will be discharged on Lopressor and     Norvasc and we will the leave the decision regarding ACE inhibitor     for his physician. 3. Acute renal insufficiency, resolved. 4. Poorly controlled diabetes mellitus, adjustment of his medication     was done during hospital stay.  Currently,     we felt the patient is stable for discharge, need to follow up with     Dr. Lajoyce Corners.  Per discussion  with Dr. Lajoyce Corners, he felt the osteomyelitis     of his leg could be part of it secondary to gout and the patient     will be discharged with allopurinol and colchicine.     Maryanna Stuber Bosie Helper, MD     HIE/MEDQ  D:  06/14/2010  T:  06/14/2010  Job:  [id number]  Electronically Signed by Ebony Cargo MD on 06/16/2010 05:10:55 PM

## 2010-06-27 LAB — COMPREHENSIVE METABOLIC PANEL
ALT: 23 U/L (ref 0–53)
AST: 22 U/L (ref 0–37)
Alkaline Phosphatase: 70 U/L (ref 39–117)
Alkaline Phosphatase: 91 U/L (ref 39–117)
BUN: 10 mg/dL (ref 6–23)
CO2: 26 mEq/L (ref 19–32)
Calcium: 8.3 mg/dL — ABNORMAL LOW (ref 8.4–10.5)
Calcium: 9.1 mg/dL (ref 8.4–10.5)
Chloride: 103 mEq/L (ref 96–112)
GFR calc non Af Amer: >60 mL/min (ref >60)
GFR calc non Af Amer: >60 mL/min (ref >60)
Glucose, Bld: 205 mg/dL — ABNORMAL HIGH (ref 70–99)
Glucose, Bld: 231 mg/dL — ABNORMAL HIGH (ref 70–99)
Potassium: 3.7 mEq/L (ref 3.5–5.1)
Potassium: 4.1 mEq/L (ref 3.5–5.1)
Sodium: 137 mEq/L (ref 135–145)
Total Bilirubin: 0.4 mg/dL (ref 0.3–1.2)
Total Protein: 6.5 g/dL (ref 6.0–8.3)

## 2010-06-27 LAB — CBC
HCT: 34.2 % — ABNORMAL LOW (ref 39.0–52.0)
Hemoglobin: 11.7 g/dL — ABNORMAL LOW (ref 13.0–17.0)
Hemoglobin: 13.3 g/dL (ref 13.0–17.0)
MCHC: 34.1 g/dL (ref 30.0–36.0)
MCHC: 32.8 g/dL (ref 30.0–36.0)
RBC: 4.8 MIL/uL (ref 4.22–5.81)
RDW: 14 % (ref 11.5–15.5)
WBC: 12.8 10*3/uL — ABNORMAL HIGH (ref 4.0–10.5)

## 2010-06-27 LAB — DIFFERENTIAL
Basophils Relative: 0 % (ref 0–1)
Basophils Relative: 0 % (ref 0–1)
Eosinophils Absolute: 0 10*3/uL (ref 0.0–0.7)
Eosinophils Relative: 0 % (ref 0–5)
Monocytes Relative: 5 % (ref 3–12)
Neutro Abs: 4.7 10*3/uL (ref 1.7–7.7)
Neutrophils Relative %: 62 % (ref 43–77)
Neutrophils Relative %: 84 % — ABNORMAL HIGH (ref 43–77)

## 2010-06-27 LAB — URINE MICROSCOPIC-ADD ON

## 2010-06-27 LAB — URINALYSIS, ROUTINE W REFLEX MICROSCOPIC
Bilirubin Urine: NEGATIVE
Ketones, ur: NEGATIVE mg/dL
Leukocytes, UA: NEGATIVE
Nitrite: NEGATIVE
Protein, ur: >300 mg/dL — AB

## 2010-06-27 LAB — LIPASE, BLOOD: Lipase: 19 U/L (ref 11–59)

## 2010-06-27 NOTE — Op Note (Signed)
  [name], [name]                ACCOUNT NO.:  [id number]  MEDICAL RECORD NO.:  [id number]           PATIENT TYPE:  I  LOCATION:  1609                         FACILITY:  [facility]  PHYSICIAN:  Nadara Mustard, MD     DATE OF BIRTH:  03/21/1971  DATE OF PROCEDURE:  06/11/2010 DATE OF DISCHARGE:                              OPERATIVE REPORT   PREOPERATIVE DIAGNOSIS:  Osteomyelitis with Wagner grade 3 ulcer, left great toe.  POSTOPERATIVE DIAGNOSIS:  Osteomyelitis with Wagner grade 3 ulcer, left great toe.  PROCEDURE:  Left foot first ray amputation.  SURGEON:  Nadara Mustard, MD  ANESTHESIA:  General.  ESTIMATED BLOOD LOSS:  Minimal.  ANTIBIOTICS:  Unasyn preoperatively.  DRAINS:  None.  COMPLICATIONS:  None.  TOURNIQUET TIME:  None.  DISPOSITION:  To PACU in stable condition.  INDICATIONS FOR PROCEDURE:  The patient is a 40 year old gentleman, type 2 diabetes, end-stage renal disease, peripheral vascular disease status post multiple toe amputations, who presents at this time with osteomyelitis confirmed by an MRI scan with chronic draining sinus ulcer over the left great toe.  Due to his osteomyelitis and chronic infection, the patient presents at this time for left foot first ray amputation.  Risks and benefits were discussed including infection, neurovascular injury, persistent pain, nonhealing incision, need for higher-level amputation.  The patient states he understands and wished to proceed at this time.  DESCRIPTION OF PROCEDURE:  The patient was brought to OR room #6 and underwent general anesthetic.  After adequate level of anesthesia was obtained, the patient's left lower extremity was prepped using DuraPrep and draped into a sterile field.  A racket incision was made around the toe along the medial border of the first metatarsal.  This was resected in one block of tissue with the ulcer tissue bone resected through the base of the first metatarsal.  This was  resected as one block of tissue. The wound was irrigated with normal saline.  Hemostasis was obtained electrocautery.  This incision was closed without tension on the skin with a modified vertical mattress suture.  The wound was covered with Adaptic orthopedic sponges, ABD dressing, Kerlix and Coban.  The patient was extubated, taken to PACU in stable condition.  PT touchdown weightbearing on the left.  Followup in the office in 1 week.     Nadara Mustard, MD     MVD/MEDQ  D:  06/11/2010  T:  06/11/2010  Job:  [id number]  Electronically Signed by Aldean Baker MD on 06/27/2010 04:31:43 PM

## 2010-06-29 LAB — CBC
HCT: 29 % — ABNORMAL LOW (ref 39.0–52.0)
HCT: 36.5 % — ABNORMAL LOW (ref 39.0–52.0)
HCT: 39.2 % (ref 39.0–52.0)
Hemoglobin: 11.9 g/dL — ABNORMAL LOW (ref 13.0–17.0)
Hemoglobin: 12.6 g/dL — ABNORMAL LOW (ref 13.0–17.0)
Hemoglobin: 9.9 g/dL — ABNORMAL LOW (ref 13.0–17.0)
MCHC: 34.1 g/dL (ref 30.0–36.0)
MCHC: 34.3 g/dL (ref 30.0–36.0)
MCHC: 34.5 g/dL (ref 30.0–36.0)
MCHC: 34.6 g/dL (ref 30.0–36.0)
MCV: 84.8 fL (ref 78.0–100.0)
Platelets: 172 10*3/uL (ref 150–400)
Platelets: 187 10*3/uL (ref 150–400)
Platelets: 227 10*3/uL (ref 150–400)
RBC: 3.41 MIL/uL — ABNORMAL LOW (ref 4.22–5.81)
RBC: 3.87 MIL/uL — ABNORMAL LOW (ref 4.22–5.81)
RBC: 4.21 MIL/uL — ABNORMAL LOW (ref 4.22–5.81)
RBC: 4.32 MIL/uL (ref 4.22–5.81)
RDW: 13.6 % (ref 11.5–15.5)
RDW: 13.7 % (ref 11.5–15.5)
RDW: 13.8 % (ref 11.5–15.5)
RDW: 13.9 % (ref 11.5–15.5)
WBC: 6 10*3/uL (ref 4.0–10.5)
WBC: 6.1 10*3/uL (ref 4.0–10.5)
WBC: 7 10*3/uL (ref 4.0–10.5)

## 2010-06-29 LAB — BASIC METABOLIC PANEL
BUN: 23 mg/dL (ref 6–23)
BUN: 25 mg/dL — ABNORMAL HIGH (ref 6–23)
BUN: 27 mg/dL — ABNORMAL HIGH (ref 6–23)
BUN: 30 mg/dL — ABNORMAL HIGH (ref 6–23)
CO2: 23 mEq/L (ref 19–32)
CO2: 26 mEq/L (ref 19–32)
CO2: 26 mEq/L (ref 19–32)
CO2: 29 mEq/L (ref 19–32)
Calcium: 8 mg/dL — ABNORMAL LOW (ref 8.4–10.5)
Calcium: 8.3 mg/dL — ABNORMAL LOW (ref 8.4–10.5)
Calcium: 8.6 mg/dL (ref 8.4–10.5)
Calcium: 8.6 mg/dL (ref 8.4–10.5)
Calcium: 8.8 mg/dL (ref 8.4–10.5)
Chloride: 102 mEq/L (ref 96–112)
Chloride: 104 mEq/L (ref 96–112)
Creatinine, Ser: 1.28 mg/dL (ref 0.4–1.5)
Creatinine, Ser: 1.4 mg/dL (ref 0.4–1.5)
Creatinine, Ser: 1.86 mg/dL — ABNORMAL HIGH (ref 0.4–1.5)
GFR calc Af Amer: 33 mL/min — ABNORMAL LOW (ref >60)
GFR calc Af Amer: 49 mL/min — ABNORMAL LOW (ref >60)
GFR calc Af Amer: >60 mL/min (ref >60)
GFR calc non Af Amer: 44 mL/min — ABNORMAL LOW (ref >60)
GFR calc non Af Amer: 56 mL/min — ABNORMAL LOW (ref >60)
GFR calc non Af Amer: 60 mL/min — ABNORMAL LOW (ref >60)
Glucose, Bld: 120 mg/dL — ABNORMAL HIGH (ref 70–99)
Glucose, Bld: 166 mg/dL — ABNORMAL HIGH (ref 70–99)
Glucose, Bld: 192 mg/dL — ABNORMAL HIGH (ref 70–99)
Glucose, Bld: 199 mg/dL — ABNORMAL HIGH (ref 70–99)
Glucose, Bld: 233 mg/dL — ABNORMAL HIGH (ref 70–99)
Potassium: 4.1 mEq/L (ref 3.5–5.1)
Potassium: 4.5 mEq/L (ref 3.5–5.1)
Potassium: 4.6 mEq/L (ref 3.5–5.1)
Sodium: 135 mEq/L (ref 135–145)
Sodium: 137 mEq/L (ref 135–145)

## 2010-06-29 LAB — DIFFERENTIAL
Basophils Absolute: 0 10*3/uL (ref 0.0–0.1)
Basophils Absolute: 0 10*3/uL (ref 0.0–0.1)
Basophils Relative: 1 % (ref 0–1)
Basophils Relative: 1 % (ref 0–1)
Basophils Relative: 1 % (ref 0–1)
Eosinophils Absolute: 0.1 10*3/uL (ref 0.0–0.7)
Eosinophils Absolute: 0.2 10*3/uL (ref 0.0–0.7)
Eosinophils Relative: 2 % (ref 0–5)
Eosinophils Relative: 3 % (ref 0–5)
Lymphocytes Relative: 33 % (ref 12–46)
Lymphs Abs: 2 10*3/uL (ref 0.7–4.0)
Lymphs Abs: 3.5 10*3/uL (ref 0.7–4.0)
Monocytes Absolute: 0.3 10*3/uL (ref 0.1–1.0)
Monocytes Absolute: 0.3 10*3/uL (ref 0.1–1.0)
Monocytes Relative: 5 % (ref 3–12)
Monocytes Relative: 5 % (ref 3–12)
Neutro Abs: 2.4 10*3/uL (ref 1.7–7.7)
Neutro Abs: 3.5 10*3/uL (ref 1.7–7.7)
Neutrophils Relative %: 59 % (ref 43–77)

## 2010-06-29 LAB — GLUCOSE, CAPILLARY
Glucose-Capillary: 134 mg/dL — ABNORMAL HIGH (ref 70–99)
Glucose-Capillary: 140 mg/dL — ABNORMAL HIGH (ref 70–99)
Glucose-Capillary: 144 mg/dL — ABNORMAL HIGH (ref 70–99)
Glucose-Capillary: 153 mg/dL — ABNORMAL HIGH (ref 70–99)
Glucose-Capillary: 167 mg/dL — ABNORMAL HIGH (ref 70–99)
Glucose-Capillary: 176 mg/dL — ABNORMAL HIGH (ref 70–99)
Glucose-Capillary: 180 mg/dL — ABNORMAL HIGH (ref 70–99)
Glucose-Capillary: 185 mg/dL — ABNORMAL HIGH (ref 70–99)
Glucose-Capillary: 189 mg/dL — ABNORMAL HIGH (ref 70–99)
Glucose-Capillary: 199 mg/dL — ABNORMAL HIGH (ref 70–99)
Glucose-Capillary: 204 mg/dL — ABNORMAL HIGH (ref 70–99)
Glucose-Capillary: 205 mg/dL — ABNORMAL HIGH (ref 70–99)
Glucose-Capillary: 219 mg/dL — ABNORMAL HIGH (ref 70–99)
Glucose-Capillary: 228 mg/dL — ABNORMAL HIGH (ref 70–99)
Glucose-Capillary: 239 mg/dL — ABNORMAL HIGH (ref 70–99)
Glucose-Capillary: 286 mg/dL — ABNORMAL HIGH (ref 70–99)
Glucose-Capillary: 335 mg/dL — ABNORMAL HIGH (ref 70–99)
Glucose-Capillary: 57 mg/dL — ABNORMAL LOW (ref 70–99)
Glucose-Capillary: 69 mg/dL — ABNORMAL LOW (ref 70–99)
Glucose-Capillary: 89 mg/dL (ref 70–99)

## 2010-06-29 LAB — COMPREHENSIVE METABOLIC PANEL
ALT: 15 U/L (ref 0–53)
ALT: 16 U/L (ref 0–53)
AST: 16 U/L (ref 0–37)
AST: 16 U/L (ref 0–37)
AST: 17 U/L (ref 0–37)
Albumin: 3.1 g/dL — ABNORMAL LOW (ref 3.5–5.2)
Alkaline Phosphatase: 81 U/L (ref 39–117)
Alkaline Phosphatase: 83 U/L (ref 39–117)
BUN: 24 mg/dL — ABNORMAL HIGH (ref 6–23)
CO2: 26 mEq/L (ref 19–32)
CO2: 27 mEq/L (ref 19–32)
Calcium: 8.6 mg/dL (ref 8.4–10.5)
Calcium: 9.2 mg/dL (ref 8.4–10.5)
Chloride: 106 mEq/L (ref 96–112)
Chloride: 106 mEq/L (ref 96–112)
Creatinine, Ser: 1.31 mg/dL (ref 0.4–1.5)
GFR calc Af Amer: >60 mL/min (ref >60)
GFR calc Af Amer: >60 mL/min (ref >60)
GFR calc non Af Amer: 55 mL/min — ABNORMAL LOW (ref >60)
GFR calc non Af Amer: >60 mL/min (ref >60)
Glucose, Bld: 301 mg/dL — ABNORMAL HIGH (ref 70–99)
Potassium: 4.3 mEq/L (ref 3.5–5.1)
Potassium: 4.4 mEq/L (ref 3.5–5.1)
Sodium: 138 mEq/L (ref 135–145)
Sodium: 138 mEq/L (ref 135–145)
Total Bilirubin: 0.6 mg/dL (ref 0.3–1.2)
Total Bilirubin: 0.8 mg/dL (ref 0.3–1.2)
Total Protein: 7.1 g/dL (ref 6.0–8.3)

## 2010-06-29 LAB — HEMOGLOBIN A1C
Hgb A1c MFr Bld: 11 % — ABNORMAL HIGH (ref <5.7)
Mean Plasma Glucose: 269 mg/dL — ABNORMAL HIGH (ref <117)

## 2010-06-29 LAB — CULTURE, BLOOD (ROUTINE X 2): Culture: NO GROWTH

## 2010-06-29 LAB — URINE MICROSCOPIC-ADD ON

## 2010-06-29 LAB — URINALYSIS, ROUTINE W REFLEX MICROSCOPIC
Glucose, UA: >1000 mg/dL — AB
Glucose, UA: NEGATIVE mg/dL
Hgb urine dipstick: NEGATIVE
Ketones, ur: NEGATIVE mg/dL
Leukocytes, UA: NEGATIVE
Protein, ur: 100 mg/dL — AB
Protein, ur: NEGATIVE mg/dL
Specific Gravity, Urine: 1.026 (ref 1.005–1.030)
Urobilinogen, UA: 0.2 mg/dL (ref 0.0–1.0)
pH: 5 (ref 5.0–8.0)

## 2010-06-29 LAB — CK TOTAL AND CKMB (NOT AT ARMC)
CK, MB: 8.1 ng/mL (ref 0.3–4.0)
Relative Index: 1.7 (ref 0.0–2.5)
Relative Index: 1.9 (ref 0.0–2.5)

## 2010-06-29 LAB — LIPID PANEL
Cholesterol: 114 mg/dL (ref 0–200)
HDL: 35 mg/dL — ABNORMAL LOW (ref >39)
LDL Cholesterol: 55 mg/dL (ref 0–99)
Triglycerides: 119 mg/dL (ref <150)

## 2010-06-29 LAB — URINE CULTURE: Colony Count: NO GROWTH

## 2010-06-29 LAB — RAPID URINE DRUG SCREEN, HOSP PERFORMED
Amphetamines: NOT DETECTED
Barbiturates: NOT DETECTED
Benzodiazepines: NOT DETECTED

## 2010-06-29 LAB — POCT CARDIAC MARKERS: Myoglobin, poc: 199 ng/mL (ref 12–200)

## 2010-06-29 LAB — TROPONIN I: Troponin I: 0.03 ng/mL (ref 0.00–0.06)

## 2010-06-29 LAB — CK: Total CK: 113 U/L (ref 7–232)

## 2010-07-02 LAB — GLUCOSE, CAPILLARY: Glucose-Capillary: 275 mg/dL — ABNORMAL HIGH (ref 70–99)

## 2010-07-02 LAB — DIFFERENTIAL
Basophils Absolute: 0 10*3/uL (ref 0.0–0.1)
Lymphocytes Relative: 22 % (ref 12–46)
Monocytes Absolute: 0.5 10*3/uL (ref 0.1–1.0)
Monocytes Relative: 6 % (ref 3–12)
Neutro Abs: 6.2 10*3/uL (ref 1.7–7.7)
Neutrophils Relative %: 69 % (ref 43–77)

## 2010-07-02 LAB — CBC
Hemoglobin: 12.4 g/dL — ABNORMAL LOW (ref 13.0–17.0)
RBC: 4.42 MIL/uL (ref 4.22–5.81)
RDW: 13.6 % (ref 11.5–15.5)

## 2010-07-02 LAB — BASIC METABOLIC PANEL
Calcium: 9 mg/dL (ref 8.4–10.5)
Creatinine, Ser: 1.42 mg/dL (ref 0.4–1.5)
GFR calc Af Amer: >60 mL/min (ref >60)
GFR calc non Af Amer: 55 mL/min — ABNORMAL LOW (ref >60)
Sodium: 137 mEq/L (ref 135–145)

## 2010-07-02 LAB — CULTURE, BLOOD (ROUTINE X 2)
Culture: NO GROWTH
Culture: NO GROWTH

## 2010-07-02 NOTE — Progress Notes (Signed)
NAMEHARACE, MCCLUNEY NO.:  [id number]  MEDICAL RECORD NO.:  [id number]           PATIENT TYPE:  LOCATION:                                 FACILITY:  PHYSICIAN:  Ramiro Harvest, MD    DATE OF BIRTH:  [birth year]                                PROGRESS NOTE   This is a progress note covering the events from June 09, 2010 through June 13, 2010.  CURRENT DIAGNOSES: 1. Chronic osteomyelitis with Wagner grade 3 ulcer of the left great     toe, status post left first ray amputation on June 11, 2010, per     Dr. Lajoyce Corners. 2. Hypertension. 3. Acute renal failure, resolved. 4. Poorly controlled type 2 diabetes.  Hemoglobin A1c of 15.6. 5. Anemia. 6. History of peripheral neuropathy. 7. Gout. 8. Peripheral vascular disease. 9. Charcot joint on the left foot. 10.Status post laparoscopic cholecystectomy. 11.Status post amputations of 2nd, 4th, 5th great toe on the left     foot. 12.Excision of left cuboid bone on the left foot. 13.Status post amputation of the great toe, on the right foot.  CURRENT MEDICATIONS: 1. Norvasc 10 mg p.o. daily. 2. Augmentin 875 mg p.o. t.i.d. 3. Aspirin 81 mg p.o. daily. 4. Sliding scale insulin. 5. Lantus 40 units subcutaneous nightly. 6. Lopressor 25 mg p.o. b.i.d.  DISPOSITION AND FOLLOWUP: Will be dictated per discharging physician.  CONSULTATIONS DONE: 1. Orthopedic consultation was done.  The patient was seen in     consultation by Dr. Lajoyce Corners on June 09, 2010. 2. ID consultation was done.  The patient was seen in consultation by     Dr. Maurice March on June 09, 2010.  PROCEDURES PERFORMED: 1. Left foot first ray amputation was done on June 11, 2010, per Dr.     Lajoyce Corners. 2. X-ray of the left foot was done, June 06, 2010, that showed     acute osteolysis, involving multiple bones of the midfoot, as     described.  Differential diagnosis include acute osteomyelitis and     neuropathic arthropathy (Charcot joint). 3. MRI  of the left foot was done, June 08, 2010, that showed     osteomyelitis of the remnant of the proximal pharyngeal bone of the     great toe, probable neuropathic changes in the mid and lateral     cuneiform and cuboid, abnormal edema and enhancement of the callus     with an ankle joint effusion.  This could be due to Charcot joint,     but infection could give exactly the same appearing.  BRIEF ADMISSION HISTORY: [name] is a 40 year old African American gentleman with history of developing a blister on the stump of his left great toe, on his left foot, 2 weeks prior to admission.  The patient did not come in to seek medical treatment because he thought he was getting better, but 2 days prior to admission he started having a lot of pain and then 1 day prior to admission he opened and it started to drain, so he presented to the ED.  On the day of admission,  drainage was foul-smelling and was having a lot of pain in his foot.  He got concern because he is having multiple amputations and as such presented in.  The rest of admission history and physical, please see H and P dictated per Dr. Shawnie Dapper of job [id number].  HOSPITAL COURSE: 1. Chronic osteomyelitis with Wagner grade 3 ulcer of the left great     toe, status post left foot first ray amputation.  The patient was     admitted to the medical general floor for left foot osteitis on     admission as well as osteomyelitis.  The patient was placed     empirically on IV vancomycin and Zosyn.  MRI was ordered with     results as stated above.  A wound culture was also obtained which     grew abundant group B strep.  The patient was maintained on     vancomycin and Zosyn a orthopedic consultation was done.  The     patient was seen in consultation by Dr. Lajoyce Corners on June 09, 2010.     It was felt the patient would benefit from an amputation.  ID     consultation was subsequently obtained with Dr. Maurice March of Infectious     Disease on  June 09, 2010, and the recommendations at that     juncture was to narrow down the patient's antibiotics to Unasyn     prior to surgery and continue the patient on IV Unasyn for his     amputation.  It was felt that this was a chronic osteomyelitis and     on discharge, the patient will benefit from a high-dose Augmentin     875 mg p.o. t.i.d. for 6 weeks for suppression.  The patient     subsequently underwent his amputation per Dr. Lajoyce Corners on June 11, 2010.  The patient had his amputation successfully done without any     complications.  The patient remained in stable condition and     maintained on IV Unasyn.  His white count remained normal.  The     patient remained afebrile.  The patient will be transitioned to     oral Augmentin to oral on June 14, 2010, and when the patient is     stable for discharge, he will be discharged home per discharging     physician on 6 more weeks of oral Augmentin.  The patient is     currently in stable condition. 2. Hypertension.  On admission, the patient did have a history of     hypertension.  The patient was on an ACE inhibitor for his blood     pressure control, however, due to his kidney function and the     patient being in further worsening renal function, his ACE     inhibitor was discontinued.  He was maintained on Norvasc, dose was     adjusted and titrated.  He was started on Lopressor with a good     blood pressure control. The patient has been maintained on this.     Patient will follow up with his PCP as an outpatient and at that     time he will be deferred to PCP as to whether to start the patient     back on his ACE inhibitor.  The patient's blood pressure is     currently well controlled. 3. Acute renal failure.  On admission, the patient was noted to  have a     worsening in his renal function.  His ACE inhibitor was held.  His     creatinine went up as high as 1.54. The patient was hydrated gently     with IV fluids with  resolution of acute renal failure such that by     June 13, 2010, the patient's creatinine was down to 1.20.  The     patient will follow up with his PCP as an outpatient. 4. Poorly controlled type 2 diabetes.  The patient did have a history     of type 2 diabetes.  Hemoglobin A1c which was obtained during this     hospitalization came back elevated at 15.6.  The patient was     maintained on Lantus at 40 units subcutaneous daily as well as a     sliding scale insulin.  His CBGs as of June 13, 2010, have ranged     114 through 130.  The patient has received diabetes education     during this hospitalization and may benefit from outpatient     diabetes.  The rest of the patient's chronic issues have remained stable.  It has been a pleasure taking care of Mr. Kimi Kroft.     Ramiro Harvest, MD     DT/MEDQ  D:  06/13/2010  T:  06/13/2010  Job:  [id number]  Electronically Signed by Ramiro Harvest MD on 07/02/2010 12:35:20 PM

## 2010-07-16 LAB — HEMOGLOBIN A1C
Hgb A1c MFr Bld: 12 % — ABNORMAL HIGH (ref 4.6–6.1)
Mean Plasma Glucose: 298 mg/dL

## 2010-07-16 LAB — CARDIAC PANEL(CRET KIN+CKTOT+MB+TROPI)
CK, MB: 3.6 ng/mL (ref 0.3–4.0)
Relative Index: 2.4 (ref 0.0–2.5)
Troponin I: 0.02 ng/mL (ref 0.00–0.06)

## 2010-07-16 LAB — COMPREHENSIVE METABOLIC PANEL
ALT: 10 U/L (ref 0–53)
Alkaline Phosphatase: 86 U/L (ref 39–117)
BUN: 12 mg/dL (ref 6–23)
BUN: 12 mg/dL (ref 6–23)
CO2: 27 mEq/L (ref 19–32)
Calcium: 8.4 mg/dL (ref 8.4–10.5)
Chloride: 104 mEq/L (ref 96–112)
Creatinine, Ser: 1.37 mg/dL (ref 0.4–1.5)
GFR calc non Af Amer: 58 mL/min — ABNORMAL LOW (ref >60)
Glucose, Bld: 256 mg/dL — ABNORMAL HIGH (ref 70–99)
Glucose, Bld: 331 mg/dL — ABNORMAL HIGH (ref 70–99)
Potassium: 4 mEq/L (ref 3.5–5.1)
Sodium: 136 mEq/L (ref 135–145)
Total Bilirubin: 0.3 mg/dL (ref 0.3–1.2)
Total Bilirubin: 0.7 mg/dL (ref 0.3–1.2)
Total Protein: 5.8 g/dL — ABNORMAL LOW (ref 6.0–8.3)

## 2010-07-16 LAB — GLUCOSE, CAPILLARY
Glucose-Capillary: 128 mg/dL — ABNORMAL HIGH (ref 70–99)
Glucose-Capillary: 159 mg/dL — ABNORMAL HIGH (ref 70–99)
Glucose-Capillary: 198 mg/dL — ABNORMAL HIGH (ref 70–99)
Glucose-Capillary: 211 mg/dL — ABNORMAL HIGH (ref 70–99)
Glucose-Capillary: 223 mg/dL — ABNORMAL HIGH (ref 70–99)
Glucose-Capillary: 234 mg/dL — ABNORMAL HIGH (ref 70–99)
Glucose-Capillary: 255 mg/dL — ABNORMAL HIGH (ref 70–99)
Glucose-Capillary: 259 mg/dL — ABNORMAL HIGH (ref 70–99)
Glucose-Capillary: 269 mg/dL — ABNORMAL HIGH (ref 70–99)
Glucose-Capillary: 298 mg/dL — ABNORMAL HIGH (ref 70–99)
Glucose-Capillary: 95 mg/dL (ref 70–99)

## 2010-07-16 LAB — CBC
HCT: 32.1 % — ABNORMAL LOW (ref 39.0–52.0)
HCT: 35 % — ABNORMAL LOW (ref 39.0–52.0)
HCT: 36.7 % — ABNORMAL LOW (ref 39.0–52.0)
Hemoglobin: 10.8 g/dL — ABNORMAL LOW (ref 13.0–17.0)
Hemoglobin: 11.8 g/dL — ABNORMAL LOW (ref 13.0–17.0)
MCHC: 33.6 g/dL (ref 30.0–36.0)
MCV: 82.4 fL (ref 78.0–100.0)
Platelets: 177 10*3/uL (ref 150–400)
RBC: 3.87 MIL/uL — ABNORMAL LOW (ref 4.22–5.81)
RBC: 4.25 MIL/uL (ref 4.22–5.81)
RDW: 14.6 % (ref 11.5–15.5)
RDW: 14.7 % (ref 11.5–15.5)
WBC: 6.7 10*3/uL (ref 4.0–10.5)
WBC: 7.4 10*3/uL (ref 4.0–10.5)

## 2010-07-16 LAB — BASIC METABOLIC PANEL
BUN: 9 mg/dL (ref 6–23)
Chloride: 102 mEq/L (ref 96–112)
GFR calc non Af Amer: >60 mL/min (ref >60)
Potassium: 3.7 mEq/L (ref 3.5–5.1)
Sodium: 137 mEq/L (ref 135–145)

## 2010-07-16 LAB — DIFFERENTIAL
Basophils Absolute: 0 10*3/uL (ref 0.0–0.1)
Lymphocytes Relative: 26 % (ref 12–46)
Lymphs Abs: 2.5 10*3/uL (ref 0.7–4.0)
Neutrophils Relative %: 68 % (ref 43–77)

## 2010-07-16 LAB — PROTIME-INR
INR: 1.1 (ref 0.00–1.49)
Prothrombin Time: 14 seconds (ref 11.6–15.2)

## 2010-07-16 LAB — SEDIMENTATION RATE: Sed Rate: 39 mm/hr — ABNORMAL HIGH (ref 0–16)

## 2010-07-16 LAB — LIPID PANEL
Cholesterol: 141 mg/dL (ref 0–200)
HDL: 34 mg/dL — ABNORMAL LOW (ref >39)
Total CHOL/HDL Ratio: 4.1 RATIO
VLDL: 29 mg/dL (ref 0–40)

## 2010-07-16 LAB — POCT CARDIAC MARKERS: Troponin i, poc: <0.05 ng/mL (ref 0.00–0.09)

## 2010-07-16 LAB — LIPASE, BLOOD: Lipase: 11 U/L (ref 11–59)

## 2010-07-16 LAB — CK TOTAL AND CKMB (NOT AT ARMC)
Relative Index: 2.6 — ABNORMAL HIGH (ref 0.0–2.5)
Total CK: 142 U/L (ref 7–232)

## 2010-07-16 LAB — TROPONIN I: Troponin I: 0.02 ng/mL (ref 0.00–0.06)

## 2010-07-17 LAB — CBC
MCHC: 33.5 g/dL (ref 30.0–36.0)
MCV: 84 fL (ref 78.0–100.0)
Platelets: 177 10*3/uL (ref 150–400)
WBC: 7.7 10*3/uL (ref 4.0–10.5)

## 2010-07-17 LAB — COMPREHENSIVE METABOLIC PANEL
ALT: 12 U/L (ref 0–53)
AST: 15 U/L (ref 0–37)
Albumin: 3.4 g/dL — ABNORMAL LOW (ref 3.5–5.2)
Calcium: 9.3 mg/dL (ref 8.4–10.5)
Creatinine, Ser: 1.26 mg/dL (ref 0.4–1.5)
GFR calc Af Amer: >60 mL/min (ref >60)
Sodium: 137 mEq/L (ref 135–145)
Total Protein: 7.2 g/dL (ref 6.0–8.3)

## 2010-07-17 LAB — APTT: aPTT: 27 seconds (ref 24–37)

## 2010-07-17 LAB — GLUCOSE, CAPILLARY
Glucose-Capillary: 156 mg/dL — ABNORMAL HIGH (ref 70–99)
Glucose-Capillary: 178 mg/dL — ABNORMAL HIGH (ref 70–99)
Glucose-Capillary: 199 mg/dL — ABNORMAL HIGH (ref 70–99)
Glucose-Capillary: 206 mg/dL — ABNORMAL HIGH (ref 70–99)
Glucose-Capillary: 350 mg/dL — ABNORMAL HIGH (ref 70–99)
Glucose-Capillary: 421 mg/dL — ABNORMAL HIGH (ref 70–99)
Glucose-Capillary: 90 mg/dL (ref 70–99)

## 2010-07-19 LAB — DIFFERENTIAL
Basophils Absolute: 0 10*3/uL (ref 0.0–0.1)
Basophils Relative: 0 % (ref 0–1)
Basophils Relative: 0 % (ref 0–1)
Eosinophils Relative: 0 % (ref 0–5)
Eosinophils Relative: 1 % (ref 0–5)
Lymphocytes Relative: 12 % (ref 12–46)
Lymphocytes Relative: 18 % (ref 12–46)
Lymphs Abs: 1.4 10*3/uL (ref 0.7–4.0)
Lymphs Abs: 2.2 10*3/uL (ref 0.7–4.0)
Monocytes Absolute: 1 10*3/uL (ref 0.1–1.0)
Monocytes Absolute: 1 10*3/uL (ref 0.1–1.0)
Monocytes Absolute: 1.3 10*3/uL — ABNORMAL HIGH (ref 0.1–1.0)
Monocytes Relative: 10 % (ref 3–12)
Neutro Abs: 10.4 10*3/uL — ABNORMAL HIGH (ref 1.7–7.7)
Neutro Abs: 10.8 10*3/uL — ABNORMAL HIGH (ref 1.7–7.7)
Neutro Abs: 9.3 10*3/uL — ABNORMAL HIGH (ref 1.7–7.7)
Neutro Abs: 9.5 10*3/uL — ABNORMAL HIGH (ref 1.7–7.7)
Neutrophils Relative %: 72 % (ref 43–77)

## 2010-07-19 LAB — WOUND CULTURE

## 2010-07-19 LAB — URIC ACID: Uric Acid, Serum: 10.3 mg/dL — ABNORMAL HIGH (ref 4.0–7.8)

## 2010-07-19 LAB — GLUCOSE, CAPILLARY
Glucose-Capillary: 149 mg/dL — ABNORMAL HIGH (ref 70–99)
Glucose-Capillary: 199 mg/dL — ABNORMAL HIGH (ref 70–99)
Glucose-Capillary: 214 mg/dL — ABNORMAL HIGH (ref 70–99)
Glucose-Capillary: 251 mg/dL — ABNORMAL HIGH (ref 70–99)
Glucose-Capillary: 255 mg/dL — ABNORMAL HIGH (ref 70–99)
Glucose-Capillary: 271 mg/dL — ABNORMAL HIGH (ref 70–99)
Glucose-Capillary: 300 mg/dL — ABNORMAL HIGH (ref 70–99)
Glucose-Capillary: 305 mg/dL — ABNORMAL HIGH (ref 70–99)

## 2010-07-19 LAB — BASIC METABOLIC PANEL
CO2: 26 mEq/L (ref 19–32)
CO2: 27 mEq/L (ref 19–32)
Calcium: 8.4 mg/dL (ref 8.4–10.5)
Calcium: 8.5 mg/dL (ref 8.4–10.5)
Creatinine, Ser: 1.22 mg/dL (ref 0.4–1.5)
GFR calc Af Amer: 54 mL/min — ABNORMAL LOW (ref >60)
GFR calc Af Amer: >60 mL/min (ref >60)
GFR calc non Af Amer: >60 mL/min (ref >60)
Glucose, Bld: 235 mg/dL — ABNORMAL HIGH (ref 70–99)
Potassium: 3.4 mEq/L — ABNORMAL LOW (ref 3.5–5.1)
Potassium: 3.8 mEq/L (ref 3.5–5.1)
Sodium: 133 mEq/L — ABNORMAL LOW (ref 135–145)
Sodium: 136 mEq/L (ref 135–145)
Sodium: 136 mEq/L (ref 135–145)

## 2010-07-19 LAB — CBC
HCT: 31.8 % — ABNORMAL LOW (ref 39.0–52.0)
HCT: 32.3 % — ABNORMAL LOW (ref 39.0–52.0)
Hemoglobin: 10.9 g/dL — ABNORMAL LOW (ref 13.0–17.0)
Hemoglobin: 11.1 g/dL — ABNORMAL LOW (ref 13.0–17.0)
Hemoglobin: 11.3 g/dL — ABNORMAL LOW (ref 13.0–17.0)
Hemoglobin: 11.4 g/dL — ABNORMAL LOW (ref 13.0–17.0)
MCHC: 34.6 g/dL (ref 30.0–36.0)
MCHC: 34.9 g/dL (ref 30.0–36.0)
MCHC: 35 g/dL (ref 30.0–36.0)
MCV: 82.1 fL (ref 78.0–100.0)
MCV: 82.4 fL (ref 78.0–100.0)
RBC: 3.8 MIL/uL — ABNORMAL LOW (ref 4.22–5.81)
RBC: 3.88 MIL/uL — ABNORMAL LOW (ref 4.22–5.81)
RDW: 13.8 % (ref 11.5–15.5)
WBC: 12.1 10*3/uL — ABNORMAL HIGH (ref 4.0–10.5)

## 2010-07-19 LAB — COMPREHENSIVE METABOLIC PANEL
CO2: 27 mEq/L (ref 19–32)
Calcium: 8.2 mg/dL — ABNORMAL LOW (ref 8.4–10.5)
Creatinine, Ser: 1.55 mg/dL — ABNORMAL HIGH (ref 0.4–1.5)
GFR calc non Af Amer: 50 mL/min — ABNORMAL LOW (ref >60)
Glucose, Bld: 356 mg/dL — ABNORMAL HIGH (ref 70–99)

## 2010-07-19 LAB — CULTURE, BLOOD (ROUTINE X 2): Culture: NO GROWTH

## 2010-07-19 LAB — LIPID PANEL
Cholesterol: 107 mg/dL (ref 0–200)
Total CHOL/HDL Ratio: 5.1 RATIO
VLDL: 18 mg/dL (ref 0–40)

## 2010-07-19 LAB — VANCOMYCIN, TROUGH: Vancomycin Tr: 20.5 ug/mL — ABNORMAL HIGH (ref 10.0–20.0)

## 2010-07-22 LAB — POCT I-STAT, CHEM 8
BUN: 25 mg/dL — ABNORMAL HIGH (ref 6–23)
Chloride: 102 mEq/L (ref 96–112)
Creatinine, Ser: 1.4 mg/dL (ref 0.4–1.5)
Sodium: 135 mEq/L (ref 135–145)
TCO2: 28 mmol/L (ref 0–100)

## 2010-07-22 LAB — GLUCOSE, CAPILLARY: Glucose-Capillary: 338 mg/dL — ABNORMAL HIGH (ref 70–99)

## 2010-08-24 NOTE — Op Note (Signed)
[name], [name]                ACCOUNT NO.:  [id number]   MEDICAL RECORD NO.:  [id number]          PATIENT TYPE:  INP   LOCATION:  5511                         FACILITY:  [facility]   PHYSICIAN:  Shirley Friar, MDDATE OF BIRTH:  11/24/1970   DATE OF PROCEDURE:  DATE OF DISCHARGE:                               OPERATIVE REPORT   INDICATION:  Abdominal pain, anemia.   MEDICATIONS:  Fentanyl 50 mcg IV, Versed 6 mg IV, and Cetacaine spray.   FINDINGS:  The endoscope was inserted through the oropharynx, and  esophagus was intubated, which is normal in its entirety.  The endoscope  was advanced down into the stomach, which revealed normal stomach, body,  and distal stomach.  Retroflexion was done, which revealed a focal area  of erythema in the fundus without any ulcers or mass seen.  Remaining  portion of proximal stomach was normal.  The endoscope was straightened  and advanced down to the duodenal bulb and second portion of the  duodenum, which were both normal.  Endoscope was withdrawn to confirm  the above findings.   ASSESSMENT:  1. Focal erythema in fundus likely due to mucosal irritation from      retching and vomiting.  2. Otherwise, normal upper endoscopy.   PLAN:  1. The patient needs surgical consult due to symptomatic gallstones      and possbile cholecystitis.  2. Proton pump inhibitor each day.  3. Liquid diet for now.      Shirley Friar, MD  Electronically Signed     VCS/MEDQ  D:  07/22/2007  T:  07/22/2007  Job:  [id number]

## 2010-08-24 NOTE — Consult Note (Signed)
NAMEEVERRETT, LACASSE                ACCOUNT NO.:  [id number]   MEDICAL RECORD NO.:  [id number]          PATIENT TYPE:  INP   LOCATION:  5511                         FACILITY:  [facility]   PHYSICIAN:  Shirley Friar, MDDATE OF BIRTH:  18-Jan-1971   DATE OF CONSULTATION:  DATE OF DISCHARGE:                                 CONSULTATION   REQUESTING PHYSICIAN:  Zenaida Deed. Mayford Knife, M.D.   REASON FOR CONSULTATION:  Abdominal pain and anemia.   HISTORY OF PRESENT ILLNESS:  A 40 year old black male with history of  diabetes, hypertension, and history of gastroparesis who was admitted  secondary to abdominal pain, nausea, and vomiting.  On July 19, 2007, he  started having severe 10/10 right upper quadrant abdominal discomfort  with associated vomiting.  The pain was constant that day and subsided  somewhat on July 20, 2007.  He felt big knot on the right upper side  of his abdomen that he has never noticed before.  He does report  recurrent heartburn and has severe burning sensation especially when his  food came back up.  He denies any rectal bleeding, denies any black  stools, and denies any fevers or chills.  He has lost about 25 pounds  over the last few months due to decreased p.o. intake.  He reports about  2 months ago he had pain similar to what is going on now, but he feels  like this pain was not as severe as it is now.  He also takes omeprazole  for heartburn, although he says the pain currently is unrelated to it.  It is completely different from his heartburn.   He had a CAT scan with contrast, which showed no acute findings in his  gut.  He did have some edema around his kidneys.   In terms of his anemia, he has had chronic history of low blood count,  specifically hemoglobin dropped down to 9.5 per chart record in August  2006, and he had a low TIBC in August 2005.  On presentation to this  hospital, his hemoglobin was 12.1, on July 19, 2007, with an iron  saturation of 11%  and ferritin of 101.  His iron level was 29.  Again,  he denies any hematochezia or melena.   PAST MEDICAL HISTORY:  1. Diabetes.  2. Hypertension.  3. History of gastroparesis.  4. History of anemia.  5. Peripheral venous stasis of his lower extremities.  6. History of amputation of 3 toes on his left foot.  7. History of  pneumonia.  8. History of rhabdomyolysis.  9. History of acute renal insufficiency.   MEDICATIONS:  On admission, Lantus, Glucotrol, Monopril, and omeprazole.   ALLERGIES:  No known drug allergies.   SOCIAL HISTORY:  Occasional alcohol.   PHYSICAL EXAMINATION:  VITAL SIGNS:  Temperature 97.9, pulse 80, blood  pressure 133/82, and O2 sat 95% on room air.  GENERAL:  Alert in no acute distress.  ABDOMEN:  Liver edge tender and palpable 3 cm below the right costal  margin.  Abdomen nontender otherwise, soft, nondistended, and positive  bowel  sounds.   IMPRESSION:  A 40 year old black male with past medical history as  stated above presents with severe right upper quadrant abdominal  discomfort with the finding of tender hepatomegaly on exam.  He also has  chronic anemia with no source of visible bleeding.  Primary team has  ordered a right upper quadrant abdominal ultrasound, which I agree with  doing, but we will also change to full abdominal ultrasound to look at  the liver parenchyma.  His tender hepatomegaly is concerned despite  normal liver function tests, and we will check hepatitis panel.  He may  need to do further liver tests depending on the results of his  ultrasound.  I recommend guaiac in his stools x3.  I doubt his pain is  from his gastroparesis, and I think peptic ulcer disease is also less  likely.  However, due to his weight loss and continued failure to  thrive, we will plan to do upper endoscopy to see if there is any  concern for gastric outlet obstructions, which will prevent p.o. intake.  I discussed risks and benefits of upper  endoscopy, and he agrees to  proceed.  Whether he needs a colonoscopy while he is here is also  possible, but we will hold off on prep of his colonoscopy at this time.  We plan to do the upper endoscopy on July 22, 2007.  Further  recommendations regarding his hepatomegaly may be forthcoming depending  on the results from his ultrasound.      Shirley Friar, MD  Electronically Signed     VCS/MEDQ  D:  07/21/2007  T:  07/22/2007  Job:  [id number]

## 2010-08-24 NOTE — Consult Note (Signed)
NAMEAGAPITO, HANWAY                ACCOUNT NO.:  [id number]   MEDICAL RECORD NO.:  [id number]          PATIENT TYPE:  INP   LOCATION:  5511                         FACILITY:  [facility]   PHYSICIAN:  Wilmon Arms. Corliss Skains, M.D. DATE OF BIRTH:  09/20/1970   DATE OF CONSULTATION:  07/22/2007  DATE OF DISCHARGE:                                 CONSULTATION   CONSULTANT PHYSICIAN:  Shirley Friar, MD   REASON FOR CONSULTATION:  Acute cholecystitis.   HISTORY OF PRESENT ILLNESS:  Mr. Ly is a 40 year old male with a  past medical history significant for diabetes type 1 who was admitted on  July 19, 2007 for right upper quadrant pain, nausea, and vomiting.  The  patient states that over the last 2 months, he has had intermittent  episodes of right upper quadrant pain radiating around to his back, but  these were fairly mild.  He cannot really correlate his episodes to any  eating, but he does say that eating chicken with hot sauce seems to have  bother him more recently.  On Thursday, he began having a severe attack  of right upper quadrant pain, radiating through to his back associated  with nausea and vomiting.  He also felt what appeared to be a mass in  his right upper quadrant.  He presented to the emergency department  where he was evaluated and admitted to the [facility].  They began their workup with a CT scan of the abdomen and  pelvis performed on July 19, 2007.  This showed a normal-appearing  liver.  The pancreas, spleen, and adrenals all appeared normal.  The  bowel appeared normal.  The gallbladder was distended with some  stranding of the fat planes around the gallbladder.  The patient was  then further evaluated with an ultrasound performed on July 22, 2007.  This showed cholelithiasis with sludge, increase gallbladder distention,  and gallbladder wall thickening up to 6 mm.  The patient had a positive  sonographic Murphy sign.  His common bile duct  was normal.  He was then  seen by Gastroenterology because the patient has had some chronic  anemia.  They perform an EGD this morning, which showed some focal  erythema in the fundus of the gallbladder but no ulcers.  We are now  consulted to evaluate his gallbladder.   The patient states that his pain is much improved.  He is occasionally  requiring some morphine and still has occasional episodes of sharp pain.   PAST MEDICAL HISTORY:  1. Diabetes type 1.  2. Hypertension.  3. Gastroesophageal reflux with a small hiatal hernia.   PAST SURGICAL HISTORY:  Three toes amputated from his left foot.   ALLERGIES:  None.   MEDICATIONS:  Lantus, Glucotrol, Monopril, and Prilosec.   SOCIAL HISTORY:  The patient formally drinks regularly with about a 6-  pack a week but has recently cut back.  He denies smoking.   FAMILY HISTORY:  Diabetes and prostate cancer.   PHYSICAL EXAMINATION:  VITAL SIGNS:  Temperature 98.2, pulse 116, blood  pressure  152/98, respirations 18, and 98% on room air.  GENERAL:  This is a well-developed, well-nourished male in no apparent  distress.  HEENT:  EOMI.  Sclerae anicteric.  LUNGS:  Clear to auscultation bilaterally.  Normal respiratory effort.  HEART:  Regular rate and rhythm.  No murmur.  ABDOMEN:  Positive bowel sounds, soft, tender in the right upper  quadrant with radiation around to his back.  He had no surgical scars.  EXTREMITIES:  Missing 3 toes on the left foot.  He has some distal  venostasis changes.  SKIN:  No sign of jaundice.   LABORATORY DATA:  INR is 1.3.  Hepatic function panel is within normal  limits except for an alkaline phosphatase of 153.  White count is 11.9,  hemoglobin 12.2, platelet count 240.  Hepatitis C antibody and hepatitis  B surface antigen are negative.  His white count is decreased from  admission.   IMPRESSION:  Acute cholecystitis.   PLAN:  The patient is eating today.  So, we will not push for surgery  today.   We will start the patient on IV antibiotics.  Clear liquids  today and p.o. after midnight.  I counseled the patient regarding the  need for a laparoscopic cholecystectomy.  We will schedule this  tomorrow.      Wilmon Arms. Tsuei, M.D.  Electronically Signed     MKT/MEDQ  D:  07/22/2007  T:  07/23/2007  Job:  [id number]   cc:   Shirley Friar, MD

## 2010-08-24 NOTE — H&P (Signed)
NAMEJEROMY, [name]                ACCOUNT NO.:  [id number]   MEDICAL RECORD NO.:  [id number]          PATIENT TYPE:  INP   LOCATION:  5511                         FACILITY:  [facility]   PHYSICIAN:  Leighton Roach McDiarmid, M.D.DATE OF BIRTH:  March 10, 1971   DATE OF ADMISSION:  07/19/2007  DATE OF DISCHARGE:                              HISTORY & PHYSICAL   PRIMARY CARE PHYSICIAN:  [facility].   CHIEF COMPLAINT:  Abdominal pain, nausea, vomiting.   HISTORY OF PRESENT ILLNESS:  The patient is a 40 year old African  American male with hypertension, diabetes here with nausea, vomiting,  abdominal pain and questionable right-sided abdominal mass on exam.  He  has been having nausea for the past few days and started having  abdominal pain this morning which radiates to his right back.  The  abdominal pain was followed by acute onset of bilious vomiting  approximately 2 hours after the abdominal pain started.  The vomiting  started  about seven o'clock a.m. this morning.  Today he also notes he  had a lump in the right side of his abdomen as well.  He did not notice  this before.  He had some dysphagia to solids yesterday and had to chase  what he ate down with liquids.  He did not notice this prior to  yesterday.  He has vomited seven times today.  He denies bright red  blood per rectum.  Denies melena, did have diarrhea that was darker in  color few days ago though today he had a solid yellow stools this  morning that was smaller than his normal amount for him when he does  have a bowel movement.  He denies fever.  He does say that he has had a  25 pound weight loss from 220 to 195 over the last several months he is  currently not tolerating p.o. he did eat a PB&J sandwich and soup  yesterday.  Of note he did have a similar pain to what he has now two  months ago that was relieved with omeprazole.  He is only taking this  p.r.n. now.  He denies any history of STDs, denies alcohol use  currently.  Denies drug use.  Denies dysuria.  He went to [facility] today and was sent via EMS here to the emergency department due to  intractable vomiting with concern for bowel obstruction or decreased  delayed gastric emptying/gastroparesis.   PAST MEDICAL HISTORY:  1. Diabetes diagnosed 40.  He was started on pills at that time.  He      is currently on insulin.  2. Hypertension.  This was also diagnosed in 1988.  He takes Monopril      for this.  3. He had a upper GI small-bowel follow-through February 2006 that      showed a small hiatal hernia and significant delay in gastric      emptying  4. History of anemia.  His hemoglobin August 2007 was 11.6.  It did      get down as far as 9.5 in August of 06.  The last one in the      computer in the hospital was 12.8 and that was in October 2007.  He      does have a TIBC that was low August 2005 in the computer system so      it appears that this is anemia of chronic disease.  5. Diabetes associated with peripheral venous stasis changes of his      legs.   MEDICATIONS:  1. Lantus of 45 units at bedtime.  2. Glucotrol 5 mg daily.  3. Monopril 20 mg daily.  4. Omeprazole as needed.   SURGERIES:  Amputation of three toes off his left foot.  He has had no  abdominal surgeries.   His most recent admission was August 2006.  This was for pneumonia, mild  rhabdomyolysis and acute renal insufficiency and uncontrolled diabetes.   ALLERGIES:  NO KNOWN DRUG ALLERGIES.  NO KNOWN FOOD ALLERGIES   SOCIAL HISTORY:  Occasional alcohol, last time he drank was one month  ago.  He said he usually drinks six beers at a time when he does.  Denies tobacco, lives in [place] with his roommate.  He works at  [employer] as a Production designer, theatre/television/film.  Denies cocaine or marijuana or other illegal  drugs.   FAMILY HISTORY:  Mom with diabetes.  There is cancer, prostate cancer on  dad's side of the family though not dad, brother is healthy.   VITAL  SIGNS:  Temperature 98.2, blood pressure 180/103, pulse 110,  respiratory 28, satting 98% on room air.  GENERAL:  He is alert but appears to be in pain.  HEENT: Extraocular muscles intact.  Pupils equal, round, react to light.  No icterus.  Oropharynx no exudates or edema.  NECK: No lymphadenopathy.  He does have dry mucous membranes and the  tongue is green from the bilious vomit.  There is bilious  vomit in the  emesis basin next to him that is yellow and green in color.  CARDIOVASCULAR:  Heart tachycardic.  No murmurs, rubs or gallops.  +2  dorsalis pedis and radial pulses.  PULMONARY:  Lungs clear to auscultation bilaterally anterior fields.  ABDOMEN: Soft, nondistended, markedly tender right upper quadrant and  epigastric area, unable to hear bowel sounds, guarding in right upper  quadrant plus epigastric area, hepatomegaly on exam versus other  abdominal mass 4-5 cm below the rib cage.  Green bilious vomiting in  emesis basin.  EXTREMITIES:  Ichthyosis bilaterally. He is able to feel light touch in  the bottom of both his feet.  He does have amputation of three toes on  his left foot.  NEURO:  Alert and oriented x3.   Labs from Urgent Care include a hemoccult was negative.  White blood  cell count 8.7, hemoglobin 12.4, platelets 254, hematocrit 38.5 MCV  82.9, RDW 14.1.  UA nitrate negative, leukocyte esterase negative,  protein 300, specific gravity 1.025, ketones 15, glucose 250.  White  blood cell 0 to 3, epithelial cells 0 to 2, mucus trace, CBG 246.   ASSESSMENT/PLAN:  The patient is a 40 year old African American male  with hypertension, insulin-dependent diabetes, here with nausea,  vomiting, abdominal pain and new hepatomegaly versus right upper  quadrant abdominal mass.   1. Nausea, vomiting, bowel pain unclear etiology.  Could be      obstruction versus hepatitis versus pancreatitis versus      gastroparesis versus gastric ulcer.  Will get a stat abdomen and  pelvis CT with p.o. and IV contrast if he is able to take p.o. and      his renal function is okay.  Will check a CMET, lipase stat make      n.p.o., place NG tube if possible and put on suction as well as to      give p.o. contrast.  Will give Zofran p.r.n. nausea and morphine      p.r.n. abdominal pain and check all stools and heme check emesis,      Protonix 40 mg b.i.d. just in case of possible GI bleeding.  2. Dehydration.  Will give normal saline 2 liters bolus x1 then 150 mL      per hour.  3. Diabetes. Increased CBGs, will start sliding scale insulin and      check a hemoglobin A1c, will give low dose Lantus 5 units daily      since he is on this at home at a much greater dose.  Will make him      n.p.o.  4. Anemia.  Heme check and gastric occult all stools.  However, this      appears to be anemia of chronic disease due to a history of anemia      as well as a low TIBC in the past but he is on Protonix.  Will      check ferritin, TIBC, and iron levels again today and a repeat CBC      in the morning as well as today.  5. Hypertension, currently elevated blood pressures.  We will see how      well he does after his pain is controlled and nausea medications      and fluid are given.  Will hold his blood pressure medications at      this time and see if he needs them.  6. Prophylaxis.  Early ambulation hopefully and SCDs.  Hesitate to use      Lovenox secondary to explained anemia      at this time with abdominal pain.  7. Disposition pending hydration and abdominal pelvic CT and      resolution of nausea, vomiting and abdominal pain.      Alanda Amass, M.D.  Electronically Signed      Leighton Roach McDiarmid, M.D.  Electronically Signed    JH/MEDQ  D:  07/19/2007  T:  07/20/2007  Job:  [id number]   cc:   [facility]

## 2010-08-24 NOTE — H&P (Signed)
[name], [name]                ACCOUNT NO.:  [id number]   MEDICAL RECORD NO.:  [id number]          PATIENT TYPE:  INP   LOCATION:  0104                         [facility]ACILITY:  [facility]   PHYSICIAN:  Hillery Aldo, M.D.   DATE OF BIRTH:  1970/06/09   DATE OF ADMISSION:  11/14/2007  DATE OF DISCHARGE:                              HISTORY & PHYSICAL   PRIMARY CARE PHYSICIAN:  None.   CHIEF COMPLAINT:  Left foot wound, pain.   HISTORY OF PRESENT ILLNESS:  Patient is a 40 year old male with a past  medical history of poorly controlled type 2 diabetes and a staph  infection involving the left lateral foot, subsequent callus formation.  Over the past several days, the patient has developed draining around  the callus site and the drainage smell has turned foul.  He also reports  pain in the left foot radiating up into his calf which is similar to the  pain he experienced when he had a bad infection in that left foot in the  past.  He denies any fever or chills.  He denies nausea, vomiting, and  diarrhea.  He has noticed elevated blood glucoses since his foot started  to drain.  Patient has a history of diabetic neuropathy and multiple  surgeries to his left foot for osteomyelitis and infection.  He is being  admitted for further evaluation and initiation of IV antibiotic therapy.   PAST MEDICAL HISTORY:  1. Type 2 diabetes since 1988.  2. Neuropathy.  3. Gastroparesis.  4. Periodontal disease.  5. Anemia of chronic disease.  6. Hypertension.  7. History of peritonsillar abscess.  8. History of obesity.  9. Small hiatal hernia.  10.History of hospitalization for pneumonia.  11.History of rhabdomyolysis.  12.History of acute renal insufficiency.   PAST SURGICAL HISTORY:  1. Amputation of the 2nd, 4th, 5th, and great toe on the left      secondary to MRSA and osteomyelitis.  2. Laparoscopic cholecystectomy.   FAMILY HISTORY:  Patient's mother is alive at 72 and has diabetes and  hypertension.  Patient's father is 42 and healthy.  Patient has a  brother who is 65 and has diabetes.  He has 1 healthy daughter.   SOCIAL HISTORY:  Patient is single and currently is living at [place].  Denies any recent alcohol intake.  Denies tobacco or  drug use.  He is currently working as a Conservation officer, nature.   ALLERGIES:  NO KNOWN DRUG ALLERGIES.   MEDICATIONS:  Lantus 30 units subcutaneously daily.   REVIEW OF SYSTEMS:  As noted in the elements of the HPI above.  Otherwise, negative with the exception of polydipsia and polyuria.   PHYSICAL EXAM:  Temperature 98.6.  Pulse 84.  Respirations 18.  Blood  pressure 165/78.  O2 saturation 98% on room air.  GENERAL:  Mildly obese male who is in no acute distress.  HEENT:  Normocephalic, atraumatic.  PERRL.  EOMI.  Extraocular movements  are intact.  Poor dentition.  NECK:  Supple, no thyromegaly, no lymphadenopathy, no jugular venous  distention.  CHEST:  Lungs  clear to auscultation bilaterally with good air movement.  HEART:  Regular rate, rhythm.  No murmurs, rubs, or gallops.  ABDOMEN:  Soft, nontender, nondistended with normoactive bowel sounds.  EXTREMITIES:  Patient has prominent scaling and dry skin to his lower  extremities.  He has amputations to the left great toe tip, 2nd, 4th,  and 5th toes.  He has an area of callus on the left lateral foot with  some surrounding erythema and swelling.  There is a small area of tissue  breakdown which is not actively draining at this time.  SKIN:  Warm and dry.  Question ichthyosis.  NEUROLOGIC:  Patient is alert and oriented x3.  Cranial nerves II-XII  grossly intact.  Nonfocal.   DATA:  Reviewed   Plain films of the left foot shows soft tissue defect at the tarsal  level.  No sign of underlying osteomyelitis.   LABORATORY DATA:  White blood cell count is 9.8, hemoglobin 12.5,  hematocrit 37.4, platelets 192.  Sodium is 137, potassium 3.6, chloride  102, bicarb 28, BUN 9,  creatinine 1.16, glucose 445.  Liver function  studies are within normal limits with an albumin level that is low at  2.9.   ASSESSMENT AND PLAN:  1. Diabetic foot ulcer:  We will admit the patient and attempt to      obtain cultures of the drainage from the wound.  We will obtain a      wound care R.N. consultation for debridement of the callus and for      recommendations of other wound care issues.  We will initiate broad      spectrum antibiotics with Zosyn and vancomycin to cover      polymicrobial diabetic foot wounds, as well as MRSA given his known      history of this.  If the patient does not improve on empiric IV      antibiotic therapy, would obtain an MRI or a bone scan to further      evaluate as to whether there is any underlying osteomyelitis.  2. Poorly controlled diabetes: Patient clearly needs diabetes      education and a better insulin regimen.  He is maintained only on      long-acting insulin.  His sugar is markedly elevated here though he      does not have any evidence of diabetic ketoacidosis.  At this time,      I will continue his Lantus at 30 units and start him on sliding      scale insulin, along with meal coverage for resistant scale given      his active infection.  We will titrate his insulin up to achieve      euglycemia.  We will check a hemoglobin A1c and initiate diabetes      education.  We will maintain him on a carbohydrate-modified diet.  3. Hypertension:  We will start the patient on lisinopril given his      concurrent diabetes.  This is inadequate in controlling his blood      pressure, would add either a diuretic or Norvasc.  4. Hypoalbuminemia:  This is likely secondary to acute phase reactant      to inflammation and infection.  5. Prophylaxis:  We will initiate DVT prophylaxis with Lovenox.      Stress ulcer prophylaxis will be provided by proton pump inhibitor      therapy.      Hillery Aldo, M.D.  Electronically Signed  CR/MEDQ  D:  11/14/2007  T:  11/14/2007  Job:  [id number]

## 2010-08-24 NOTE — Op Note (Signed)
[name], [name]                ACCOUNT NO.:  [id number]   MEDICAL RECORD NO.:  [id number]          PATIENT TYPE:  OIB   LOCATION:  2550                         FACILITY:  [facility]   PHYSICIAN:  Nadara Mustard, MD     DATE OF BIRTH:  06/08/1970   DATE OF PROCEDURE:  11/18/2008  DATE OF DISCHARGE:                               OPERATIVE REPORT   PREOPERATIVE DIAGNOSIS:  Osteomyelitis and abscess, right great toe.   POSTOPERATIVE DIAGNOSIS:  Osteomyelitis and abscess, right great toe.   PROCEDURE:  Amputation of right great toe.   SURGEON:  Nadara Mustard, MD   ANESTHESIA:  General plus ankle block.   ESTIMATED BLOOD LOSS:  Minimal.   ANTIBIOTICS:  Kefzol 2 g.   DRAINS:  None.   COMPLICATIONS:  None.   TOURNIQUET TIME:  None.   DISPOSITION:  To PACU in stable condition.   INDICATION FOR THE PROCEDURE:  The patient is a 39 year old gentleman  with type 2 diabetes with abscess and osteomyelitis of the right great  toe.  He has failed conservative care and presents at this time for  amputation of the right great toe.  Risks and benefits were discussed  including persistent infection, neurovascular injury, nonhealing of the  wound, need for higher-level amputation.  The patient states he  understands and wished to proceed at this time.   DESCRIPTION OF THE PROCEDURE:  The patient was brought to OR room 10  after undergoing an ankle block.  After adequate level of anesthesia was  obtained, the patient's right lower extremity was prepped using DuraPrep  and draped into a sterile field.  A fishmouth incision was made distal  to the MTP joint.  The great toe was amputated through the MTP joint.  Electrocautery was used for hemostasis.  The wound was irrigated with  normal saline.  There was no evidence of any deep abscess or deep  necrosis at the MTP joint area.  There was good petechial bleeding.  After hemostasis was obtained, the incision was closed using 2-0 nylon  with a  far-near-near-far suture  with no tension on the skin.  The wound was covered with Adaptic  orthopedic sponges, ABD dressing, Webril, and Coban.  The patient was  then taken to the PACU in stable condition.  Plan for 23-hour  observation with discharge to home in the morning.      Nadara Mustard, MD  Electronically Signed     Nadara Mustard, MD  Electronically Signed    MVD/MEDQ  D:  11/18/2008  T:  11/18/2008  Job:  [id number]

## 2010-08-24 NOTE — Op Note (Signed)
[name]                ACCOUNT NO.:  [id number]   MEDICAL RECORD NO.:  [id number]          PATIENT TYPE:  INP   LOCATION:  5511                         FACILITY:  [facility]   PHYSICIAN:  Anselm Pancoast. Weatherly, M.D.DATE OF BIRTH:  [birth year]   DATE OF PROCEDURE:  DATE OF DISCHARGE:                               OPERATIVE REPORT   PREOPERATIVE DIAGNOSES:  1. Acute cholecystitis with stones.  2. Diabetes mellitus.   POSTOPERATIVE DIAGNOSES:  1. Acute cholecystitis with stones.  2. Diabetes mellitus.   OPERATIONS:  Laparoscopic cholecystectomy with cholangiogram.   SURGEON:  Anselm Pancoast. Zachery Dakins, MD   ASSISTANT:  Leonie Man, MD.   HISTORY:  [name] is a 40 year old black male who was admitted to  the teaching service approximately 4 days ago with abdominal pain,  history of diabetes, history of gastroparesis, and was complaining of  felt like a big knot in the right upper abdomen that he had never  noticed before.  He lost about 25 pounds over the past few months with  decreased intake and had episodes of pain that felt like heartburn.  He  was seen and on examination was tender in the right upper quadrant.  He  was presented to the stand-in and had a CT of abdomen and then Dr.  Charlott Rakes was asked to see the patient on the 11th and he felt  that this was probably a gastroparesis, doubt he had peptic ulcer  disease.  He then had an upper endoscopy and then they obtained an  ultrasound of the gallbladder that showed acute cholecystitis.  Surgical  consultation was requested on yesterday and he was placed on antibiotics  and added to the OR schedule for today.  The patient is definitely  tender in the right upper quadrant with fullness.  His liver function  studies, I do not think have been that elevated and his white count was  10,400.  The patient preoperatively had been receiving dose of Unasyn  and he received a dose about 2 hours prior to going to the  operating  room.   Induction of general anesthesia, endotracheal tube.  No additional  antibiotics.  He has PAS stockings and then an oral tube was placed into  the stomach.  The abdomen with him asleep, we could feel a fullness in  the right upper quadrant.  The abdomen was clipped around the upper  abdomen, around the umbilicus and then prepped with Betadine solution  and draped in a sterile manner.  A small incision was made below the  umbilicus.  The fascia was fairly thick and we carefully entered into  the peritoneal cavity, pursestring suture of 0 Vicryl was placed and  Hasson cannula introduced.  On looking in the right upper quadrant, we  could see a big distended gallbladder that was covered with omentum.  There was a little bit of kind of bilious fluid over laterally and the  upper 10-mm trocar was placed in the subxiphoid area and 2 lateral 5-mm  trocars were placed by Dr. Lurene Shadow.  I then tried to peel this omentum  off the very distended gallbladder and there was obviously fluid around  the gallbladder that looks like it is kind of a transudate with bile  stain.  I do not see an actual hole into the gallbladder and this was  aspirated.  We then switched fairly quickly to the 30-degree scope, so  we could look down since the visualization was poor and then the omentum  that was adherent on the proximal portion of the gallbladder was kind of  freed from the gallbladder, so we could see the most proximal portion of  the gallbladder.  We then kind of carefully dissected.  There was a  little lymphatic that was identified.  It was clipped proximally,  divided, and then we could see the little branch of the cystic artery  that was separated from the cystic duct and then this was doubly clipped  proximally.  I then was able to encompass the cystic duct that was not  dilated and a clip was placed on the cystic duct-gallbladder junction  and a little opening made in the cystic duct.   Opening identified and a  Cook catheter induced and held in place with a clip.  The x-ray was  obtained.  There was a good filling of the extrahepatic biliary system,  good flow into the duodenum.  No evidence of any common duct stones and  the catheter was removed and 2 clips were placed across the cystic duct.  This was then divided and then we could tease this markedly inflamed  gallbladder that was probably necrotic in some areas from the hepatic  gallbladder bed.  We then kind of carefully dissected.  There was  another branch of the cystic artery that I think was a true cystic  artery that was doubly clipped and then divided distally and then the  gallbladder was completely freed from the bed, switching from the hook  to the spatula electrocautery.  The gallbladder was placed into an  Endocatch bag.  I did place a Blake drain from looking down.  The cystic  duct and the vessels were clipped.  There was no evidence of bleeding  and no additional fluid.  The Blake drain had been placed to the lateral  5-mm port and then the camera was switched the upper 10-mL port and the  bag containing the gallbladder grasped and brought up through the  umbilicus.  I opened the fascia a little larger, so that this large  gallbladder could be removed and then I closed the fascia at the  umbilicus with 2 figure-of-eight of 0 Vicryl in addition to the  pursestring.  The fascia at the umbilicus was then anesthetized, the  drains in good position, and the irrigating fluid removed.  Five  Millipore removed under direct vision and then the drain had been  sutured to the skin.  The carbon dioxide was released and then I placed  a single fascia suture in the subxiphoid fascial incision of 0 Vicryl.  Subcutaneous wounds were closed with 4-0 Vicryl and Benzoin and Steri-  Strips on the skin.  We will check a glucose in the recovery room.  We  will keep him on antibiotics for about 24 hours and the drain can   probably be removed in about 2 days if he is still in the hospital and  not having any bilious drainage.  The patient tolerated procedure  nicely, and should be able to go back to the floor postoperatively.  ______________________________  Anselm Pancoast. Zachery Dakins, M.D.     WJW/MEDQ  D:  07/23/2007  T:  07/24/2007  Job:  [id number]

## 2010-08-24 NOTE — Discharge Summary (Signed)
NAMESEVERO, BEBER                ACCOUNT NO.:  [id number]   MEDICAL RECORD NO.:  [id number]          PATIENT TYPE:  INP   LOCATION:  A308                          FACILITY:  [facility]   PHYSICIAN:  Osvaldo Shipper, MD     DATE OF BIRTH:  June 29, 1970   DATE OF ADMISSION:  09/19/2008  DATE OF DISCHARGE:  06/15/2010LH                               DISCHARGE SUMMARY   The patient was followed by Dr. Lajoyce Corners for his lower extremity wounds.   His PMD is unknown.   DISCHARGE DIAGNOSES:  1. Cellulitis of the right foot.  2. Possible gout.  3. Poorly controlled diabetes.  4. Poorly controlled hypertension.   BRIEF HOSPITAL COURSE:  1. Infected right foot.  The patient presented to the hospital with      pain in his right foot.  His right foot was inflamed, red,      especially the right first toe.  The patient was admitted to the      hospital and started on Vancor and Zosyn because of his history of      diabetes and history of staph in the past.  The patient has done      well with the IV antibiotics.  His cultures from August 2009      actually showed MSSA sensitive to Levaquin so the patient's      antibiotics were changed over to Levaquin and he was continued on      vancomycin for now.  The patient has remained afebrile.  His white      count is coming down.  His erythema is improved.  His pain is      improved.  He is able to ambulate.  So, in view of this, he will be      discharged home today with p.o. antibiotics.  2. Possible gout.  Because of inflamed right first toe, we checked a      uric acid level and it was elevated at 10.3.  it is possible the      patient has some gout.  He does tell me that his father has severe      gout.  He will be put on allopurinol and colchicine for now.  3. Insulin-dependent diabetes.  He has very poorly controlled      diabetes.  His HbA1c was 13.5.  He mentioned that his blood sugars      run routinely over 200.  He used to be on p.o. medication in  the      past but they were taken off.  I have increased the dose of the      Lantus to 45 units and finally his CBG this morning was about 149.  4. Hypertension.  Poorly controlled as well.  I have increased the      dose of lisinopril to 10.  He will need to follow up with his PMD      in the next few days.  5. The rest of the issues are stable.  Dr. Leticia Penna was consulted to      evaluate  of the wound and he recommended local wound care.  The      patient follows with Dr. Lajoyce Corners in [place] and I have recommended      him to continue to do so.   Imaging studies include x-ray of the of the right foot which showed  diffuse soft-tissue swelling with no acute findings.   Consultation from Dr. Leticia Penna   No procedures were done.   Wound Care was also consulted.   DISCHARGE MEDICATIONS:  1. Levaquin 500 mg once a day for 10 days.  2. Allopurinol 100 mg daily starting June 1.  3. Colchicine 0.6 mg t.i.d. for 5 days.  4. Indocin 25 mg b.i.d. for 5 days.  5. Percocet 5/500 one tablet every 6 hours as needed for pain, 20      tablets prescribed.  6. Lantus 45 units subcu q.h.s.  7. Lisinopril 10 mg daily.  8. Norvasc 10 mg daily.  9. NovoLog on a sliding scale as before.   FOLLOWUP:  1. With PMD in the next week or so.  2. With Dr. Lajoyce Corners as well.   DIET:  Modified carbohydrate.   PHYSICAL ACTIVITY:  As before.   WOUND CARE:  As instructed by Dr. Leticia Penna.   TOTAL TIME OF THIS ENCOUNTER:  35 minutes.   PLEASE NOTE:  Above is preliminary until signed.      Osvaldo Shipper, MD  Electronically Signed     GK/MEDQ  D:  09/23/2008  T:  09/23/2008  Job:  [id number]   cc:   Tilford Pillar, MD  Fax: [phone]   Nadara Mustard, MD  Fax: [phone]

## 2010-08-24 NOTE — Discharge Summary (Signed)
[name], [name]                ACCOUNT NO.:  [id number]   MEDICAL RECORD NO.:  [id number]          PATIENT TYPE:  INP   LOCATION:  1535                         FACILITY:  [facility]   PHYSICIAN:  Hillery Aldo, M.D.   DATE OF BIRTH:  06/02/1970   DATE OF ADMISSION:  11/14/2007  DATE OF DISCHARGE:  11/18/2007                               DISCHARGE SUMMARY   Primary care physician:  None.   Orthopedic surgeon:  Dr. Lajoyce Corners.   DISCHARGE DIAGNOSES:  1. Methicillin-sensitive Staphylococcus aureus cellulitis/diabetic      foot ulcer.  2. Type 2 diabetes, poorly controlled.  3. Hypertension.  4. Insensate neuropathy.  5. History of gastroparesis.  6. History of chronic normocytic anemia.   DISCHARGE MEDICATIONS:  1. Lantus 30 units subcutaneously daily.  2. Norvasc 5 mg daily.  3. Aspirin 81 mg daily.  4. Lisinopril 10 mg daily.  5. Doxycycline 100 mg b.i.d. x10 days.  6. NovoLog 8 units before meals with sliding scale insulin, insulin      resistant scale.   CONSULTATIONS:  Dr. Lajoyce Corners of Orthopedic Surgery.   BRIEF ADMISSION HISTORY OF PRESENT ILLNESS:  Patient is a 40 year old  male with past medical history of poorly controlled diabetes.  He  presented to the hospital for evaluation of a left lateral diabetic foot  ulcer with a chief complaint of foul smelling drainage.  He was admitted  for further evaluation.  For the full details, please see my dictated  report.   PROCEDURES/DIAGNOSTIC STUDIES:  Left foot films on November 14, 2007,  showed a soft tissue defect laterally at the tarsal level.  No sign of  underlying osteomyelitis.   DISCHARGE LABORATORY VALUES:  Chemistries were within normal limits with  the exception of an elevated glucose at 261.  CBC showed a white blood  cell count of 7.7, hemoglobin 11.8, hematocrit 35.1, MCV 84.3, platelets  165.  Hemoglobin A1c was 15.9.   HOSPITAL COURSE BY PROBLEM:  1. Methicillin-sensitive Staphylococcus aureus,  cellulitis/diabetic      foot:  The patient was admitted and a wound care consultation was      obtained with the wound nurse.  She did recommend orthopedic      consultation for possible debridement related to his extensive      tissue involvement.  In the interim, the patient was put on wet-to-      dry dressing changes and put on broad spectrum antibiotics      including vancomycin and Zosyn.  Dr. Lajoyce Corners, of orthopedic surgery,      did debride the area and recommended cleansing the area with Dial      soap and water daily, non-weightbearing of the left side with      crutch use, and follow-up with him and 2 weeks' time.  Cultures of      the wound drainage subsequently grew out methicillin-sensitive      Staphylococcus aureus.  He will complete an additional 10 days of      treatment with doxycycline for a total course of antibiotic therapy  of 2 weeks.  2. Poorly controlled type 2 diabetes:  The patient had been      maintaining his glycemic control on Lantus monotherapy.  Extensive      education was done with the patient with regard to the importance      of using both Lantus and a short-acting insulin.  He was encouraged      to view the diabetes videos and given a Diabetes Self-Care Tools      booklet.  His sugars were reasonably well controlled on a      combination of the 30 units of Lantus in addition to 8 units of      NovoLog for meal coverage and sliding scale, insulin resistant      scale.  He will be set up for outpatient diabetes education classes      as well.  3.  Hypertension:  The patient was started on lisinopril      and Norvasc was added for better blood pressure control.  He should      have outpatient evaluation to ensure that this regimen is      adequately controlling his blood pressure.  3. Insensate neuropathy:  The patient again was seen by Dr. Lajoyce Corners and      recommendations were made for use of a postop shoe with crutch      training and  non-weightbearing on the left side to help his      diabetic foot sore heal.  4. Gastroparesis:  No active issues referable to this issue.  5. Chronic anemia:  This is likely due to underlying chronic disease.      No specific diagnostic workup was undertaken, the patient's anemia      is very mild.   DISPOSITION:  The patient is medically stable and will be discharged  home.  He is encouraged to follow up at [facility] for ongoing care of  chronic medical problems.      Hillery Aldo, M.D.  Electronically Signed     CR/MEDQ  D:  11/18/2007  T:  11/18/2007  Job:  [id number]   cc:   Nadara Mustard, MD  Fax: [phone]

## 2010-08-24 NOTE — Discharge Summary (Signed)
NAMERICHEY, DOOLITTLE                ACCOUNT NO.:  [id number]   MEDICAL RECORD NO.:  [id number]          PATIENT TYPE:  INP   LOCATION:  5511                         FACILITY:  [facility]   PHYSICIAN:  Ancil Boozer, MD      DATE OF BIRTH:  [birth year]   DATE OF ADMISSION:  07/19/2007  DATE OF DISCHARGE:  07/25/2007                               DISCHARGE SUMMARY   REASON FOR ADMISSION:  Abdominal pain, nausea, and vomiting.   DISCHARGE DIAGNOSES:  1. Acute cholecystitis status post laparoscopic cholecystectomy.  2. Insulin-dependent diabetes mellitus type 2.  3. Hypertension.  4. Chronic anemia, iron deficient, and chronic disease.   DISCHARGE MEDICATIONS:  1. Lantus 20 units subcu q.h.s.  2. Protonix 40 mg p.o. daily.  3. Lisinopril 30 mg p.o. daily.  4. Zofran 4 mg p.o. q.8 h p.r.n.  5. Vicodin 1-2 tablets every 4-6 hours as needed.   CONSULTS:  Gastrointestinal, [facility].   PROCEDURES AND STUDIES:  The patient had a CT of the abdomen and pelvis  on July 19, 2007 which revealed mild bilateral perinephritic edema of  uncertain etiology, which could be seen with pyelonephritis.  No  evidence of urinary tract calcification or obstruction, distended, but  otherwise normal-appearing bladder.  No definitive distal ureteral  calcification or dilation in small and large bowel loops in pelvis, that  were normal.  No pelvic mass adenopathy or inflammatory process.  T-view  of the abdomen revealed an ileus versus early partial small bowel  obstruction with no free air.  Revealed findings highly suspicious for  acute cholecystitis.  No evidence of biliary ductal dilatation and  minimally limited exam.  Cholangiogram intraoperatively revealed an  opacification of the biliary tree without diltation or filling defect.  The patient had an EGD on July 22, 2007, which revealed focal erythema  of the fundus likely due to a mucosal irritation from retching and  vomiting, otherwise  was a normal upper endoscopy, and operation was  performed, lap chole on July 23, 2007, which revealed a gangrenous  gallbladder with stone, which was subsequently removed.   LABORATORY DATA:  On admission, the patient's white blood cell count  14.1, hemoglobin 12.1, hematocrit 36.5, platelet count 233, with 88%  neutrophils.  Comprehensive metabolic panel revealed sodium 134,  potassium 3.8, chloride 102, bicarb 22, glucose 203, BUN 18, creatinine  1.0, total bilirubin 0.7, alcohol phosphatase 94, AST 18, ALT 16, total  protein 7.4, albumin 3.6, and calcium 8.6.  Lipase was 12.  Cardiac  panel revealed CK 153, CK-MB 4.3, troponin-I 0.02, total iron binding  protein capacity was 265, iron was 29%, saturation was 11, ferritin was  101.  Urinalysis revealed specific gravity of 1.039, pH of 5.0, 500 of  urine glucose, 15 of ketones, trace blood, 100 protein, 0-2 red blood  cells, hepatitis B surface antigen was negative, hepatitis C antibody  was negative, and hemoglobin A1c was 11.  At the time of discharge the  patient's basic metabolic panel revealed sodium 137, potassium 3.8,  chloride 102, bicarb 29, glucose 172, BUN 2, creatinine 0.92,  and  calcium 8.2.  CBC revealed white blood cell count 7.1, hemoglobin 9.9,  hematocrit 30, and platelet count 272.   HOSPITAL COURSE:  [name] is a very pleasant 40 year old gentleman  who was admitted for abdominal pain, nausea, and vomiting, which had  been intractable for the past day.  He was treated initially with pain  regimen and was  nausea medication as above.  His pain persisted despite  adding Reglan, which we thought would help with his gastroparesis.  His  nausea did improve with the Reglan.  This was discontinued, however,  after the patient was found to have, on abdominal ultrasound, acute  cholecystitis.  [facility] was consulted and the patient  had a laparoscopic cholecystectomy on July 23, 2007.  Just prior to   that GI had been consulted for concern that the patient may also have an  ulcer, given his significant iron deficiency anemia. The patient did  well after his surgery and was doing well at the time of discharge and  able to tolerate foods without difficulty.   INSTRUCTIONS AND FOLLOW UP:  The patient was discharged home in good  condition.  The patient is to return to work as dictated by surgery.  He  is to increase his activities slowly.  He is to empty his JP drain that  was left in as needed and we will have that removed at an appointment  with [facility], phone # [phone] on July 31, 2007 at  10:45 a.m.  He also has a followup  appointment set up with his primary  care physician, Urgent Care and  family medicine [phone] on July 30, 2007 at 2 o' clock in the afternoon.  He is to follow low sodium, heart  healthy ADA diet.      Ancil Boozer, MD  Electronically Signed     SA/MEDQ  D:  07/25/2007  T:  07/26/2007  Job:  [id number]

## 2010-08-24 NOTE — H&P (Signed)
[name], [name]                ACCOUNT NO.:  [id number]   MEDICAL RECORD NO.:  [id number]          PATIENT TYPE:  INP   LOCATION:  A308                          FACILITY:  [facility]   PHYSICIAN:  Margaretmary Dys, M.D.DATE OF BIRTH:  [birth year]   DATE OF ADMISSION:  09/19/2008  DATE OF DISCHARGE:  LH                              HISTORY & PHYSICAL   ADMISSION DIAGNOSES:  1. Right big toe cellulitis.  2. Poorly controlled type 2 diabetes and history of multiple      complications from poorly controlled diabetes including multiple      amputations of the left toes.  3. Chronic left foot ulcer.  4. History of poorly controlled hypertension.   CHIEF COMPLAINT:  Swollen right big toe and foot.   HISTORY OF PRESENT ILLNESS:  [name] is a 40 year old male who  presents to the emergency room with pain and swelling of the right foot.  The patient reports that his right big toe has been swollen for about 3  days now with some cracking of the skin and some drainage about 2 days  ago.  The patient also reports having fever, sweats, chills and headache  today. The patient has had multiple complications of his diabetes  including neuropathy and also ulcers in his left foot prompting multiple  amputations.  He noted some painful swelling in his inguinal region  which was likely lymphadenitis.  The patient noted some erythema with  the skin tear.  The patient is concerned because the toes that he lost  on his left leg the initial symptoms were very similar.  The pain is  aggravated by walking and is about 8/10 at its worse, although he said  this could be much worse because he does not have any significant  feeling in his lower extremities.   The patient is now being admitted for further management and treatment.  It is noted that the last time the patient was admitted to the hospital  back in August 2009, the patient had a methicillin-sensitive  Staphylococcus aureus infection of his right  foot.  The patient has been  empirically started on vancomycin and Zosyn.   REVIEW OF SYSTEMS:  A 10-point review of systems otherwise negative  except as mentioned in the history of present illness above.   PAST MEDICAL HISTORY:  1. History of methicillin-sensitive Staphylococcus aureus      cellulitis/diabetic foot ulcer.  2. Poorly controlled type 2 diabetes mellitus.  3. History of hypertension.  4. Diabetic neuropathy.  5. History of diabetic gastroparesis.  6. History of chronic normocytic anemia.  7. History of periodontal disease.  8. History of peritonsillar abscess.  9. History of obesity.  10.Small hiatal hernia.  11.History of rhabdomyolysis.  12.History of acute renal insufficiency.   MEDICATIONS:  1. Lantus 30 units subcutaneous once a day.  2. NovoLog sliding scale.  3. Lisinopril 5 mg p.o. once a day.  4. Norvasc 10 mg p.o. once a day.   ALLERGIES:  NO KNOWN DRUG ALLERGIES.   SOCIAL HISTORY:  The patient is single.  He has been  homeless  occasionally.  Denies any recent alcohol intake.  Denies any tobacco or  drug abuse.   FAMILY HISTORY:  Mother is alive at 22 and has diabetes and  hypertension.  Father is also alive at 50 and healthy.  The patient has  a brother who is 20 and has diabetes.  He has one healthy daughter.  He  is not married.   PAST SURGICAL HISTORY:  1. Amputation of the second, fourth and fifth great toe on the left      secondary to MRSA and osteomyelitis  2. Laparoscopic cholecystectomy.   PHYSICAL EXAMINATION:  GENERAL:  The patient was conscious, alert,  comfortable, not in acute distress and was well oriented in time, place  and person.  VITAL SIGNS:  Blood pressure was 148/96, pulse is 101, respirations 20,  temperature 99 degrees Fahrenheit, oxygen saturation was 95% on room  air.  HEENT:  Normocephalic, atraumatic.  Oral mucosa was moist with no  exudates.  NECK:  Supple.  No JVD, lymphadenopathy.  LUNGS:  Clear clinically  with good air entry bilaterally.  HEART:  S1-S2 regular.  No S3, gallops or rubs.  ABDOMEN:  Soft, nontender.  Bowel sounds were positive.  No mass is  palpable.  EXTREMITIES:  The patient is status post multiple amputations  of his left toes including his big toe.  He also has a draining ulcer on  the sole of his left foot.  On the right foot, the patient has a swollen  right big toe which is erythematous and indurated.  It also appeared to  be fluctuant.  The patient has sympathetic right lymphadenitis.  CNS:  Grossly intact.   LABORATORY DATA:  White blood cell count is 14.1, hematocrit was 31.8,  hemoglobin 11.1, platelet count was 229, ESR was 95, glucose was 271,  sodium is 133, potassium 3.4, chloride of 99, CO2 was 27, glucose 314,  BUN of 17, creatinine was 1.71, calcium is 8.5.  Blood cultures pending.   DIAGNOSTICS:  X-ray of the right foot shows diffuse soft tissue swelling  with no acute fractures noted.  No definite evidence for osteomyelitis  was observed.   ASSESSMENT:  [name] is a 40 year old male who has history of type 2  diabetes poorly controlled, who has had multiple complications from his  diabetes.  The patient has had multiple amputations of his left foot  toes.  He also has a chronic ulcer in the sole of his left foot.  The  patient has had a history of MSSA cellulitis in the past.  He now  presents with right foot swelling and also cellulitis mostly of the  right big toe with no evidence of osteomyelitis on x-rays at this time.   PLAN:  1. We will admit the patient to the medical floor.  2. We will continue on IV antibiotics with vancomycin and Zosyn.  The      patient has received initial doses in the emergency room.  3. We will control pain as needed p.r.n. with Tylenol or morphine.  4. We will request surgical consult with Dr. Leticia Penna as patient's      swelling in the right big toe was fluctuant.  I am not sure if he      will be a candidate for  incision and drainage given the poor      circulation to his legs.  The patient does have evidence of chronic      stasis dermatitis bilaterally.  5. We will resume all his medications at this time including insulin.      We will put him on a moderate insulin sliding scale and may switch      it depending on how he does.  6. We will be aggressive with his diabetic control in order to avoid      his infection becoming more severe.  7. The patient may need an MRI to confirm the absence of definite      osteo of his left his left foot.   I have discussed the above plan with the patient in detail who has  verbalized full understanding.  I have requested family to help dose his  vancomycin and Zosyn due to his chronic renal insufficiency.  He will  resume all his other home medications at this time.      Margaretmary Dys, M.D.  Electronically Signed     AM/MEDQ  D:  09/20/2008  T:  09/20/2008  Job:  [id number]

## 2010-08-27 NOTE — Discharge Summary (Signed)
[name], [name]                ACCOUNT NO.:  [id number]   MEDICAL RECORD NO.:  [id number]          PATIENT TYPE:  INP   LOCATION:  5009                         FACILITY:  [facility]   PHYSICIAN:  Corinna L. Lendell Caprice, MDDATE OF BIRTH:  [birth year]   DATE OF ADMISSION:  11/16/2004  DATE OF DISCHARGE:  11/20/2004                                 DISCHARGE SUMMARY   DIAGNOSES:  1.  Community-acquired pneumonia with hypoxia.  2.  Uncontrolled diabetes.  3.  Hypertension.  4.  Acute renal insufficiency.  5.  Mild rhabdomyolysis.   DISCHARGE MEDICATIONS:  1.  Avelox 400 milligrams a day until gone.  2.  Lac-Hydrin to legs twice a day.  3.  Continue his other home medications which include Avandia 5 milligrams a      day.  4.  Glucotrol 10 milligrams a day.  5.  Lantus 45 units subcutaneously daily.  6.  Monopril 20 milligrams a day.   CONDITION:  Stable.   FOLLOW UP:  Dr. Modena Jansky in 4-6 weeks. He may return to work on November 29, 2004.   DIET:  Should be low salt diabetic.   PROCEDURES:  None.   CONSULTATIONS:  None.   PERTINENT LABORATORY DATA:  Sputum culture showed normal oropharyngeal  flora. Blood cultures negative. UA was contaminated with many epithelial  cells. HIV was nonreactive. Peak CPK was 4500 with a normal MB fraction and  index. Peak troponin was 0.11. Hemoglobin A1c was 7.9. Complete metabolic  panel was significant for a sodium of 128, glucose of 333, BUN 34,  creatinine of 1.9, albumin was 1.9. At discharge, his BUN was 24, glucose  121, sodium 139, creatinine 1.2. His INR on admission was 1.4, PTT was 39.  White blood cell count on admission was 16,000 with 82% neutrophils, 9%  lymphocytes. At discharge, his white count was 13,000, hemoglobin was 10.6,  hematocrit 31.2, MCV was 80. ABG on an unrecorded amount of oxygen revealed  a pH of 7.377, pCO2 44, pO2 62. Special studies in radiology: EKG showed  normal sinus rhythm. Chest x-ray showed bilateral diffuse  air space disease.   HISTORY AND HOSPITAL COURSE:  [name] is a 40 year old black male who  presented with shortness of breath, fever, chills and weakness for five  days. Please see H&P for complete details. His H&P on admission was  significant for temperature 102.6, blood pressure was 189/96, pulse 124,  respiratory rate 36. Oxygen saturations on room air were 70%. The patient  was in respiratory distress on admission. He had wheezes and rhonchi  present. The patient was admitted to the ICU and started on Avelox IV, given  supplemental oxygen. He was given IV fluids and he was started on IV insulin  for uncontrolled diabetes. He was also started on Zosyn subsequently and  vancomycin. The vancomycin, however, was stopped. The patient's hypoxia,  fever, shortness of breath resolved. He was given IV fluids for his mild  rhabdomyolysis. At the time of discharge, he had normal vital signs and was  feeling much better and discharged to home.  Corinna L. Lendell Caprice, MD  Electronically Signed     CLS/MEDQ  D:  01/26/2005  T:  01/26/2005  Job:  [id number]   cc:   Jethro Bastos, M.D.  Fax: [phone]
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
## 2010-08-27 NOTE — Op Note (Signed)
[name], [name]                ACCOUNT NO.:  [id number]   MEDICAL RECORD NO.:  [id number]          PATIENT TYPE:  INP   LOCATION:  NA                           FACILITY:  [facility]   PHYSICIAN:  Nadara Mustard, M.D.   DATE OF BIRTH:  1971-04-01   DATE OF PROCEDURE:  01/21/2004  DATE OF DISCHARGE:                                 OPERATIVE REPORT   PREOPERATIVE DIAGNOSIS:  Osteomyelitis and Wagner grade 3 ulcer base of the  fifth metatarsal left foot.   PROCEDURES:  1.  Fifth ray amputation left foot.  2.  Partial cuboid excision left foot.  3.  Partial amputation at the base of the fourth metatarsal left foot.   SURGEON:  Nadara Mustard, M.D.   ANESTHESIA:  LMA.   ESTIMATED BLOOD LOSS:  Minimal.   ANTIBIOTICS:  1 g of Kefzol.   TOURNIQUET TIME:  Esmarch to the ankle for approximately 15 minutes.   DISPOSITION:  To the PACU in stable condition.   INDICATIONS FOR PROCEDURE:  The patient is a 40 year old gentleman type 2  diabetic who was status post multiple previous foot infections and partial  foot amputations, who presents with recurrent infection, ulceration, and  osteomyelitis at the base of the fifth metatarsal left foot.  The patient  has undergone conservative care with wound debridement, p.o. and IV  antibiotics, as well as pressure unloading without resolution, and presents  at this time for partial foot amputation.  MRI scan was consistent with  osteomyelitis.  The risks and benefits were discussed, including infection,  neurovascular injury, nonhealing of the wound, need for a higher-level  amputation.  The patient states he understands and wishes to proceed at this  time.   SURGICAL PROCEDURE:  The patient was brought to O.R. Room 4 and underwent a  general LMA anesthetic.  After adequate levels of anesthesia obtained, the  patient's left lower extremity was prepped using DuraPrep and draped into a  sterile field.  A racket-type incision was made to include the  ulcer and the  fifth toe.  This was carried down to bone, and the fifth ray was resected in  one block of tissue including the ulcer.  Examination showed the infection  had traveled down to the base of the fourth as well as the cuboid.  A  partial amputation was also performed with the amputation partially of the  cuboid as well as the base of the fourth metatarsal.  The wound was then  irrigated with normal saline.  The tourniquet was released at 15 minutes,  and hemostasis was obtained.  The wound was then closed with no tension on  the skin with a far-near-near-far suture with 2-0 nylon.  The wound was then  covered with Adaptic orthopedic sponges, web roll, and a Coban dressing.  The patient was extubated and taken to the PACU in stable condition.  Plan  for admission with IV antibiotics and discharge to home after 48 hours of  antibiotics.       MVD/MEDQ  D:  01/21/2004  T:  01/21/2004  Job:  [id number] 
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
## 2010-08-27 NOTE — Op Note (Signed)
NAMEREUBEN, KNOBLOCK                ACCOUNT NO.:  [id number]   MEDICAL RECORD NO.:  [id number]          PATIENT TYPE:  AMB   LOCATION:  SDS                          FACILITY:  [facility]   PHYSICIAN:  Vanita Panda. Magnus Ivan, M.D.DATE OF BIRTH:  Sep 12, 1970   DATE OF PROCEDURE:  07/14/2005  DATE OF DISCHARGE:  07/14/2005                                 OPERATIVE REPORT   PREOPERATIVE DIAGNOSIS:  Left great toe distal phalanx osteomyelitis.   POSTOPERATIVE DIAGNOSIS:  Left great toe distal phalanx osteomyelitis.   PROCEDURE:  Left great toe tip amputation with irrigation and debridement.   SURGEON:  Vanita Panda. Magnus Ivan, M.D.   ANESTHESIA:  1.  Left ankle block.  2.  General anesthesia.   BLOOD LOSS:  Minimal.   ANTIBIOTICS:  One gram IV Ancef.   COMPLICATIONS:  None.   BLOOD LOSS:  Minimal.   INDICATIONS:  Briefly, Mr. Prokop is a 40 year old diabetic with peripheral  vascular disease and diabetic neuropathy.  He developed a wound on his great  toe over the last several days that became purulent.  He said he had not had  wounds on his toes in a long time.  He only had one additional toe on his  foot because the other ones have been amputated in the past.  When I saw him  in clinic two days ago, he did show x-ray evidence of osteomyelitis at the  very tip of the toe and the toe was smelly.  I debrided it in the office and  got good bleeding tissue.  I started him on antibiotics.  It was recommended  he undergo irrigation and debridement of the great toe in an operative  setting.   PROCEDURE DESCRIPTION:  After informed consent was obtained, the appropriate  left leg was marked and ankle block was obtained, then he was brought to the  operating room and placed supine on the operating table.  General anesthesia  was then obtained.  His leg was prepped with DuraPrep and sterile drapes.  The toe was identified and found to have no gross purulence but certainly  raw tissue at the  end of the toe.  I then removed the tip of the toe using a  sharp knife and then a rongeur to debride back to regular good bleeding  bone.  There was good bleeding tissue noted.  I was able to mobilize the  soft tissue and close and remove the distal phalanx in its entirety and then  debrided back the proximal phalanx.  I was then able to easily mobilize soft  tissue and close this over the  tip of the toe after thoroughly irrigating it.  Well-padded sterile dressing  was applied after the toe was closed with interrupted 3-0 nylon suture.  The  patient was then awakened, extubated and taken to the recovery room in  stable condition.           ______________________________  Vanita Panda. Magnus Ivan, M.D.     CYB/MEDQ  D:  07/14/2005  T:  07/15/2005  Job:  [id number]

## 2010-08-27 NOTE — H&P (Signed)
NAMEMATHAYUS, STANBERY                ACCOUNT NO.:  [id number]   MEDICAL RECORD NO.:  [id number]          PATIENT TYPE:  EMS   LOCATION:  MAJO                         FACILITY:  [facility]   PHYSICIAN:  Jackie Plum, M.D.DATE OF BIRTH:  1970/09/24   DATE OF ADMISSION:  11/16/2004  DATE OF DISCHARGE:                                HISTORY & PHYSICAL   CHIEF COMPLAINT:  Shortness of breath, fever and chills, and generalized  weakness x5 days.   HISTORY OF PRESENT ILLNESS:  The patient is a 40 year old African-American  gentleman with history of diabetes mellitus, hypertension, who admitted with  above complaints. According to the patient he was in his usual state of  health until 5 days ago when he started to feel weak with fever, chills, and  cough productive of yellowish sputum. He came to the ED because of  progressive worsening of his symptoms. Prior to this he had gone to [facility] whereupon he was noted to be hypoxic with his O2 saturations in  the 70's. He was given oxygen and albuterol nebulizers and sent to [facility] ED for further evaluation and management. The patient denies any chest  pain, hemoptysis, hematemesis. Admits to nausea without vomiting, abdominal  pain. He denies constipation, diarrhea, dysuria, frequency, micturition. He  believes that he had been exposed to chemicals at work. 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 He denies any chest pain, paroxysmal nocturnal  dyspnea or orthopnea. No worsening lower extremity pain or swelling, no calf  pain.   PAST MEDICAL HISTORY:  1.  History of diabetes.  2.  Hypertension.   ALLERGIES:  No known drug allergies.   CURRENT MEDICATIONS:  1.  Avandia 5 mg daily.  2.  Glucotrol 10 mg daily.  3.  Lantus 45 units daily.  4.  Monopril 20 mg daily.   FAMILY HISTORY:  Positive for diabetes.   SOCIAL HISTORY:  The patient denies smoking. He does not use alcohol.   REVIEW OF SYSTEMS:  Negative other than  in history of present illness.   PHYSICAL EXAMINATION:  GENERAL:  A 40 year old African-American gentleman  lying on a gurney. He looks quite ill, in mild respiratory distress  receiving oxygen by Venturi mask.  VITAL SIGNS:  Initial temperature was 102.6, blood pressure 189/96, pulse  124, respirations 36 but respirations have come down to 24 at the time of my  evaluation. Oxygen saturation 90% on oxygen. Subsequent to this his  temperature was recorded at 1907 Hr. was 100.5 degrees F.  HEENT:  Pupils are equal, round and reactive to light. Extraocular movements  are intact.  Oropharynx was dry. No exudates or erythema on oropharyngeal exam.  NECK:  Supple, no JVD.  LUNGS:  Decreased breath sounds with a few wheezes and rhonchi present.  CARDIAC:  Regular, no gallops or murmurs. The ED telemetry monitor indicated  sinus rhythm.  ABDOMEN:  Full, soft bowel sounds were present, normal active.  EXTREMITIES:  Chronic changes of the skin with dry scaling (using some  unknown cream for his skin), he states this  is due to his diabetes and has  seen his primary care physician for this problem. He has trace to 1+ pedal  edema. No cyanosis.  NEURO:  The patient is alert and oriented x3. No focal deficit.   LABORATORY DATA:  X-ray of the chest indicated diffuse air space disease.  WBC is 16.4 thousand, hemoglobin 10.6, hematocrit 31.2, MCV 80.0, platelet  count 237,000. Sodium 128, potassium 3.8, chloride 103, glucose 203, CO2 26,  BUN 34, creatinine 1.9. Sputum Gram stain indicated abundant wbc's,  predominantly PMNs with a gram positive foci in chains and moderate gram  positive foci in clusters and a few gram negative rods. Culture result is  pending. UA color was amber, hazy appearance, pH of 5.5, specific gravity  1.029, glucose more than 1000, hemoglobin large, small bilirubin, ketones  15, protein more than 300 mg/dl, nitrite negative, leukocyte negative, 36  rbc/hpf with many  bacteria.   IMPRESSION:  1.  Severe community acquired pneumonia in a diabetic, bilateral.  2.  Hypoxic respiratory failure, suspect from diagnosis number one.  3.  Hyponatremia, possible pneumonia with SIADH.  4.  Acute renal failure.  5.  Severe pneumonia with dehydration.  6.  Uncontrolled diabetes mellitus with hyperglycemia.  7.  Uncontrolled hypertension.   The patient had a large hemoglobin on UA with 36 rbc's which is not in  tandem to the degree of hemoglobinuria and this is suspicious for  rhabdomyolysis probably related to infectious disease.   PLAN:  The plan is to admit patient to ICU for aggressive antibiotic  coverage and IV fluid supplementation with normal saline. For his blood  pressure control will start him on Clonidine and hold his ACE inhibitor for  now due to the acute renal failure. Will start patient on IV Insulin per  Glucommander protocol.  Possibility of an environmental  induced  hypersensitivity pneumonitis cannot be ruled out. Will give him a dose of IV  steroid over night and monitor subsequently. BiPAP may possibly be needed  depending on how well he does with his oxygen supplementation. Expectorants  and nebulization will be instituted. Will obtain ABG, follow up on his  sputum culture, will repeat CBC and BMET in the morning.   The patient has dermopathy in his old history (this is unclear) and this may  need to be further followed up as an outpatient on discharge. He needs to be  watched carefully to rule out any secondary bacterial infection.       GO/MEDQ  D:  11/16/2004  T:  11/16/2004  Job:  [id number]   cc:   Jethro Bastos, M.D.  [address]  [address]  [place]  [place] [address]  Fax: [phone]

## 2010-08-27 NOTE — H&P (Signed)
NAME:  [name], [name]                          ACCOUNT NO.:  [id number]   MEDICAL RECORD NO.:  [id number]                   PATIENT TYPE:  INP   LOCATION:  5504                                 FACILITY:  [facility]   PHYSICIAN:  Renato Battles, M.D.                  DATE OF BIRTH:  Feb 12, 1971   DATE OF ADMISSION:  11/22/2003  DATE OF DISCHARGE:                                HISTORY & PHYSICAL   REASON FOR ADMISSION:  Right knee and left ankle pain.   HISTORY OF PRESENT ILLNESS:  The patient is a very pleasant 40 year old  African American male who has been experiencing two weeks of lateral left  foot oozing ulcer associated with pain.  Also reporting right knee swelling  as well as pain.  Denies any fever; however, he reports that for the last  two weeks he has been unable to walk.  His appetite is decreased and his  blood sugars have been elevated significantly.   REVIEW OF SYMPTOMS:  CONSTITUTIONAL:  No fevers, chills, or night sweats.  No weight changes.  GASTROINTESTINAL:  No nausea, vomiting, diarrhea, or  constipation.  CARDIOPULMONARY:  No chest pain, shortness of breath,  orthopnea, PND, and no cough.  GENITOURINARY:  No dysuria, hematuria, or  retention.   PAST MEDICAL HISTORY:  1. Type 2 diabetes since the age of 89.  2. Hypertension.   PAST SURGICAL HISTORY:  Negative.   SOCIAL HISTORY:  No tobacco, alcohol, or drugs.  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  He is separated.  He lives  by himself.   FAMILY HISTORY:  Reviewed and noncontributory.   MEDICATIONS:  Currently, the patient is not taking any medication for the  last three months and a half as a result of financial insurance issues.  Prior to that, he was on Amaryl 10 mg p.o. q.d.; Glucophage 1000 mg p.o.  q.d.; Actos 45 mg p.o. q.d. according to the patient's memory.   ALLERGIES:  No known drug allergies.   PHYSICAL EXAMINATION:  GENERAL:  The patient is in no acute  distress.  He is  alert and oriented x 3.  VITAL SIGNS:  Temperature 97.3, heart rate 116, respiratory rate 16, blood  pressure 177/101.  HEENT:  The patient has lost two teeth in his mouth.  This was documented in  his prior records.  No signs of infection or abscesses.  Head is  normocephalic and atraumatic.  Pupils are equal, round, and reactive to  light and accommodation.  NECK:  No lymphadenopathy, thyromegaly, and no JVD.  CHEST:  Clear to auscultation bilaterally.  No wheezing, rales, or rhonchi.  HEART:  There is a 3/6 systolic apical murmur.  ABDOMEN:  Soft, nontender, and nondistended.  Normal active bowel sounds.  EXTREMITIES:  Right knee is swollen and warm with decreased range of motion;  however, does  is not immobile.  Also the patient has a quarter-sized lateral  left foot ulcer with foul-smelling and thick calluses around it.   LABORATORY DATA:  Electrolytes showed a low sodium at 129.  Bicarbonate  19.7, BUN 19, creatinine 1.1, glucose of 440.  Urinalysis was positive for  glucose and low level of ketones.  A CBC showed white count of 15.8,  hemoglobin 13.1, platelets of 400,000.  The 82% of the white cells were  neutrophils.   IMPRESSION:  1. Very mild ketoacidosis.  2. Poorly controlled diabetes.  3. Possibly infected diabetic ulcer.  4. Hypertension.  5. Newly found murmur.   PLAN:  1. Admit to the hospital.  2. Start insulin drip with Glucometer to control the glucose.  3. Hydrate with normal saline and IV fluids.  4. Check MRI of the knee and ankle to rule out osteomyelitis.  5. Check hemoglobin A1C.  6. Check blood cultures.  7. Consult general surgery in the morning regarding the possibility of     infectious ulcer and need for debridement.  8. Check transthoracic electrocardiogram to rule out any endocarditis.                                                Renato Battles, M.D.    SA/MEDQ  D:  11/29/2003  T:  11/29/2003  Job:  [id number]

## 2010-08-27 NOTE — H&P (Signed)
[facility]  Patient:    HOGAN, HOOBLER                       MRN: [id number] Adm. Date:  [id number] Attending:  Rosanne Sack                         History and Physical  IDENTIFYING DATA:  Mr. Laneve is a very nice 40 year old black male.  Mr. Junkins states he was diagnosed with diabetes in 1988.  He moved to [place] approximately nine months ago from [place].  He was seen at the [facility] emergency room approximately three months ago when he ran out of his medication. He had been on Glucophage 1000 mg a day and Glucotrol XL 20 mg a day.  At that time, he presented with nausea and dizziness and was placed back on both medications.  Then approximately a month ago, he ran out and stopped his medication again.  He states that one or both of his medications result in a decreased appetite and diarrhea.  He does state that he has a history of periodontal disease and has had a lot of trouble with pain in the front of his mouth.  He also states he has had swelling in his legs for many years and a doctor has told him that there is nothing they can do about it.  He has no prior history of hypertension or coronary artery disease.  He states for the last 24 hours, he has had increasing thirst and has been unable to drink enough water.  PAST MEDICAL HISTORY: No known drug allergies.  CURRENT MEDICATIONS: None, although he is supposed to be on Glucophage 1000 and  Glucotrol XL 20 mg a day.  PAST MEDICAL HISTORY:  Remarkable for diabetes mellitus, diagnosed 54. Negative for hypertension, coronary artery disease, cancer, and TB.  It is remarkable for edema, which apparently is chronic.  PREVIOUS SURGERIES:  None.  FAMILY HISTORY: Remarkable for diabetes in his mother.  SOCIAL HISTORY:  He is married, two children, a son age 22, daughter age 49. Does  construction work. Does not smoke, does not consume alcohol.  REVIEW OF SYSTEMS:   He denies any headache.  He has had no nausea or vomiting. No cough, no dysuria, no abdominal pain.  PHYSICAL EXAMINATION:  Vital signs:  Temperature 99.0, blood pressure 148/91, pulse 111, O2 sat 98%.  SKIN: Warm and dry.  He does have incredibly dry skin on his legs.  HEENT:  Pupils are equal, round and reactive to light, fundi normal, disks sharp. Tympanic membranes normal.  Oropharynx moist.  Dental care is very poor.  He has an obvious dental carie in front incisor and significant periodontal disease.  LUNGS:  Clear.  HEART: Regular tachycardia without murmur.  ABDOMEN:  Soft, no hepatosplenomegaly or masses palpated.  He has no edema.  LABORATORY DATA ON ADMISSION:  WBC 11,400, hemoglobin 14.1, platelet count 229,000, neutrophils 79%, lymphs 16%, monos 4%.  pH 7.4, pCO2 45, pO2 71, serum osmolality 311, acetone negative.   Sodium 132, potassium 4.4, chloride 96, bicarb 29, glucose 522, BUN 12, creatinine 1.1, calcium 8.8, albumin 3.5, SGOT 20, SGPT 23, alkaline phosphate 110, total bilirubin 0.9, anion gap 11.  ASSESSMENT: 1. Hyperosmolar nonketotic hyperglycemia due to medication noncompliance. 2. Dehydration. 3. Pseudohyponatremia. 4. Periodontal and dental disease, question abscess.  PLAN:  Will admit.  IV fluids.  Diabetic diet; p.o.  Glucotrol; consider Acctose. Panorex to rule out an abscess. DD:  10/09/99 TD:  10/08/99 Job: [id number]

## 2010-08-27 NOTE — Consult Note (Signed)
NAMELETICIA, MCDIARMID                ACCOUNT NO.:  [id number]   MEDICAL RECORD NO.:  [id number]          PATIENT TYPE:  EMS   LOCATION:  ED                           FACILITY:  [facility]   PHYSICIAN:  Jefry H. Pollyann Kennedy, MD     DATE OF BIRTH:  02/04/1971   DATE OF CONSULTATION:  11/14/2005  DATE OF DISCHARGE:                                   CONSULTATION   TIME OF DICTATION:  2:30 p.m.   SITE:  [facility].   REASON FOR CONSULTATION:  Peritonsillar abscess.   HISTORY:  This is a 40 year old with a 6-day history of progressively  worsening sore throat and difficulty swallowing, mainly on the left side.  He was seen in the ER 3 or 4 days ago, was prescribed antihistamine and  returned again in the middle of the night.  He was given a prescription for  antibiotics and told to follow up with me if he got any worse.  He was not  able to fill the prescription because it was too expensive and contacted me  today, and I had him come back to emergency room for my evaluation.   PAST MEDICAL HISTORY:  Significant for diabetes for which she takes  medication and insulin.  Has a history of obesity in the past, has lost a  large amount of weight.  No smoking history, no other health problems.   ALLERGIES:  NO KNOWN ALLERGIES.  No prior history of throat problems.   EXAMINATION:  GENERAL:  Healthy-appearing gentleman in no distress.  He has  a hot potato voice quality.  There is no trismus.  He has some fullness of  the left jugulodigastric region.  The oral cavity and pharynx reveals a  large amount of swelling of the left soft palate.  The some exudate on the  surface and cryptic debris in the tonsil on the left.   IMPRESSION:  Probable peritonsillar abscess.   PLAN:  Is to perform incision and drainage.   PROCEDURE:  The left side of the soft palate was infiltrated with Xylocaine  with epinephrine.  A 18-gauge needle was used to aspirate a small amount of  pus from the  peritonsillar space.  A 15-scalpel was used to incise the  mucosa, and a hemostat was used to enter into the abscess cavity, when a  large amount of thick pus, approximately 10 mL was obtained.  He tolerated  this well.  He was given a dose of penicillin VK 500 mg and some pain  medication in the ER and was provided with a prescription for both of these.  He is to go home, drink lots of fluids rest and contact me Monday for follow-  up on Tuesday.  If it gets any worse, he should contact me sooner.     Jefry H. Pollyann Kennedy, MD  Electronically Signed    JHR/MEDQ  D:  01/14/2006  T:  01/15/2006  Job:  [id number]
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
## 2010-08-27 NOTE — Discharge Summary (Signed)
NAME:  [name], [name]                          ACCOUNT NO.:  [id number]   MEDICAL RECORD NO.:  [id number]                   PATIENT TYPE:  INP   LOCATION:  5504                                 FACILITY:  [facility]   PHYSICIAN:  Hettie Holstein, D.O.                 DATE OF BIRTH:  [birth year]   DATE OF ADMISSION:  11/22/2003  DATE OF DISCHARGE:  12/01/2003                                 DISCHARGE SUMMARY   PRIMARY CARE PHYSICIAN:  The patient does not have a primary care physician  at this time; therefore, we are referring him to the unassigned Corderro Koloski  list provided by [facility].  The primary care physician  found on the list was Dr. Schuyler Amor in Dr. Malon Kindle practice, and  he has been set up for a new patient visit on December 08, 2003, at 3:10 p.m.,  Monday.   ADMISSION DIAGNOSES:  1. Left foot abscess and right knee swelling.  2. Poorly controlled diabetes mellitus.   DISCHARGE DIAGNOSES:  1. Left foot ulcer with abscess status post incision and drainage by     orthopedic service, Dr. Lajoyce Corners, and inpatient course of intravenous     antibiotics with Zosyn and transition to p.o. antibiotic therapy with     good response.  MRI without evidence of osteomyelitis.  Abscess culture     revealed group F Streptococcus.  2. Poorly controlled diabetes with a hemoglobin A1C of 15.6 with history of     noncompliance with oral medications initiated this admission on Lantus     plus resumption of Glucotrol and Avandia with adequate control on this     regimen.  3. Iron-deficiency anemia with hemoccult testing revealing negative     hemoccult.  Hemoglobin 9.0 with reticulocyte count of 0.9 and iron     saturation of 8.  Initiation of oral iron replacement therapy this     admission and recommendations for outpatient followup and further     evaluation, perhaps by gastroenterology.  He is to have a followup CBC     following discharge.  4. Hypertension.  5. Resolved  hyponatremia.  6. Occult knee fracture and anterior cruciate ligament tear with     weightbearing status recommendations per Dr. Lajoyce Corners for limited     weightbearing and physical therapy in outpatient setting and continued     wound care management of his left foot.  He is instructed to follow up     with the wound care clinic at [facility] to see Dr. Lajoyce Corners.   DISCHARGE MEDICATIONS:  1. Vasotec 20 mg twice daily.  2. Glucotrol 10 mg twice daily.  3. Lantus 45 units q.h.s.  4. Nu-Iron 150 mg twice daily.  5. Avandia 4 mg daily.  6. Multivitamins daily.  7. Accuzyme daily as directed.  8. Senokot twice a day, instructed hold for diarrhea.  9. Percocet 5/325  as needed every 4 hours for pain.  10.      Instructed to continue Avelox daily for the next 10 days.   DISCHARGE INSTRUCTIONS:  1. He is to have home PT/OT.  2. His diet should be moderate consistent carbohydrate.  3. With regard to wound care, he is instructed to cleanse his left foot and     soak in room temperature water and Dial soap 20 minutes daily and then     apply Accuzyme with a dry dressing daily.  4. As noted above, he should follow up with the wound care clinic, Dr. Lajoyce Corners.  5. He was scheduled for followup appointment with Dr. Julian Reil and Dr.     Dorothe Pea on December 08, 2003, at 3:10 as obtained from the unassigned     primary care Tekeya Geffert list at [facility].  He is     instructed to call [phone] for adjustments or schedule changes.   HISTORY OF PRESENT ILLNESS:  Please see H&P as dictated by Dr. Renato Battles.  However, briefly, this is a 40 year old African-American male who has been  experiencing two weeks of lateral left foot oozing ulcer associated with  pain, reporting right knee swelling as well.  Denies fever but had been  unable to walk.  His appetite is decreased.  His blood sugars have been  elevated significantly off his medications.   HOSPITAL COURSE:  The patient was admitted.   Orthopedic surgery had seen the  patient.  Debridement and I&D were performed, and cultures revealed Group F  Streptococcus, and patient was initiated on IV antibiotics with resolution  of swelling and leukocytosis.  He underwent MRI of his knee and was noted to  have occult tibial plateau fracture in addition to ACL repair at which time  Dr. Lajoyce Corners placed him on a partial knee immobilizer.  His course was otherwise  uneventful.  He developed some diarrhea.  He was noted to have some anemia  without evidence of hemoccult positivity.  He was noted to be iron  deficient.  He was supplemented with iron and will be sent home on iron  therapy and will need followup in the outpatient setting with regard to his  CBC.   LABORATORY DATA AT TIME OF DISCHARGE:  Sodium 135, potassium 4.3, BUN 8,  creatinine 0.9, glucose 154.  WBC 10.2, hemoglobin 9, platelet count 510,  and MCV of 81.  Urinalysis was unremarkable.   At this time, he is felt to be medically stable for discharge, and we are  awaiting home care arrangements and wound care followup coordination.                                                Hettie Holstein, D.O.    ESS/MEDQ  D:  12/01/2003  T:  12/01/2003  Job:  [id number]   cc:   Jethro Bastos, M.D.  [address]  [place]  [place] [address]  Fax: [phone]   Nadara Mustard, M.D.  Fax: [phone]

## 2011-01-04 LAB — CBC
HCT: 33.5 — ABNORMAL LOW
HCT: 36.5 — ABNORMAL LOW
HCT: 30 — ABNORMAL LOW
Hemoglobin: 12.1 — ABNORMAL LOW
Hemoglobin: 12.2 — ABNORMAL LOW
MCHC: 33.4
MCHC: 33.6
MCHC: 33.8
MCHC: 34.7
MCHC: 33.1
MCV: 83
MCV: 83.1
MCV: 83.6
MCV: 83.6
MCV: 84.3
MCV: 83.9
Platelets: 223
Platelets: 249
Platelets: 272
RBC: 3.84 — ABNORMAL LOW
RBC: 3.98 — ABNORMAL LOW
RBC: 4.35
RDW: 13.5
RDW: 13.8
RDW: 14
RDW: 13.6
WBC: 10.4
WBC: 14.1 — ABNORMAL HIGH
WBC: 15.3 — ABNORMAL HIGH

## 2011-01-04 LAB — DIFFERENTIAL
Basophils Absolute: 0
Basophils Relative: 0
Eosinophils Absolute: 0
Eosinophils Absolute: 0.3
Eosinophils Relative: 0
Eosinophils Relative: 2
Lymphs Abs: 1
Monocytes Absolute: 1
Monocytes Relative: 5
Monocytes Relative: 8

## 2011-01-04 LAB — COMPREHENSIVE METABOLIC PANEL
ALT: 16
ALT: 19
ALT: 29
AST: 18
AST: 22
AST: 33
Albumin: 3.6
BUN: 18
CO2: 22
CO2: 27
Calcium: 8.3 — ABNORMAL LOW
Calcium: 8.4
Calcium: 8.6
Chloride: 102
Creatinine, Ser: 0.96
GFR calc Af Amer: >60
GFR calc Af Amer: >60
GFR calc non Af Amer: >60
Glucose, Bld: 203 — ABNORMAL HIGH
Potassium: 3.8
Potassium: 3.8
Sodium: 135
Sodium: 136
Total Protein: 6.1
Total Protein: 6.6
Total Protein: 7.4

## 2011-01-04 LAB — BASIC METABOLIC PANEL
BUN: 2 — ABNORMAL LOW
BUN: 8
BUN: 2 — ABNORMAL LOW
CO2: 26
Calcium: 8.2 — ABNORMAL LOW
Chloride: 103
Chloride: 106
Chloride: 102
Creatinine, Ser: 0.87
Creatinine, Ser: 1.07
GFR calc Af Amer: >60
GFR calc Af Amer: >60
Glucose, Bld: 172 — ABNORMAL HIGH
Potassium: 3.9
Potassium: 3.8

## 2011-01-04 LAB — CARDIAC PANEL(CRET KIN+CKTOT+MB+TROPI)
CK, MB: 2.7
Relative Index: 2.5
Relative Index: 2.8 — ABNORMAL HIGH
Total CK: 110
Troponin I: 0.02

## 2011-01-04 LAB — URINALYSIS, ROUTINE W REFLEX MICROSCOPIC
Bilirubin Urine: NEGATIVE
Specific Gravity, Urine: 1.039 — ABNORMAL HIGH
Urobilinogen, UA: 1

## 2011-01-04 LAB — HEMOGLOBIN A1C
Hgb A1c MFr Bld: 11 — ABNORMAL HIGH
Mean Plasma Glucose: 314

## 2011-01-04 LAB — PROTIME-INR
INR: 1.3
Prothrombin Time: 16 — ABNORMAL HIGH

## 2011-01-04 LAB — HEPATITIS B SURFACE ANTIGEN: Hepatitis B Surface Ag: NEGATIVE

## 2011-01-04 LAB — HEPATIC FUNCTION PANEL
Bilirubin, Direct: 0.2
Indirect Bilirubin: 0.4
Total Bilirubin: 0.6

## 2011-01-04 LAB — IRON AND TIBC
Iron: 29 — ABNORMAL LOW
Saturation Ratios: 11 — ABNORMAL LOW
UIBC: 236

## 2011-01-04 LAB — URINE MICROSCOPIC-ADD ON

## 2011-01-07 LAB — GLUCOSE, CAPILLARY
Glucose-Capillary: 100 — ABNORMAL HIGH
Glucose-Capillary: 111 — ABNORMAL HIGH
Glucose-Capillary: 123 — ABNORMAL HIGH
Glucose-Capillary: 149 — ABNORMAL HIGH
Glucose-Capillary: 158 — ABNORMAL HIGH
Glucose-Capillary: 168 — ABNORMAL HIGH
Glucose-Capillary: 240 — ABNORMAL HIGH
Glucose-Capillary: 249 — ABNORMAL HIGH
Glucose-Capillary: 260 — ABNORMAL HIGH
Glucose-Capillary: 288 — ABNORMAL HIGH
Glucose-Capillary: 92

## 2011-01-07 LAB — DIFFERENTIAL
Basophils Absolute: 0
Lymphocytes Relative: 21
Monocytes Absolute: 0.5
Neutro Abs: 7.2

## 2011-01-07 LAB — BASIC METABOLIC PANEL
Calcium: 8.3 — ABNORMAL LOW
GFR calc Af Amer: >60
GFR calc non Af Amer: >60
Potassium: 3.6
Sodium: 135

## 2011-01-07 LAB — COMPREHENSIVE METABOLIC PANEL
Albumin: 2.9 — ABNORMAL LOW
BUN: 9
Chloride: 102
Creatinine, Ser: 1.16
Total Bilirubin: 0.9
Total Protein: 6.8

## 2011-01-07 LAB — CBC
HCT: 35.1 — ABNORMAL LOW
HCT: 37.4 — ABNORMAL LOW
Hemoglobin: 11.8 — ABNORMAL LOW
MCV: 83.8
Platelets: 192
RBC: 4.17 — ABNORMAL LOW
RDW: 13.7

## 2011-01-07 LAB — URINALYSIS, ROUTINE W REFLEX MICROSCOPIC
Ketones, ur: NEGATIVE
Leukocytes, UA: NEGATIVE
Nitrite: NEGATIVE
Protein, ur: 30 — AB
Urobilinogen, UA: 0.2
pH: 5.5

## 2011-01-07 LAB — LIPID PANEL
HDL: 37 — ABNORMAL LOW
Total CHOL/HDL Ratio: 3.7

## 2011-01-07 LAB — WOUND CULTURE

## 2011-01-07 LAB — VANCOMYCIN, TROUGH: Vancomycin Tr: 12.7

## 2011-01-07 LAB — HEMOGLOBIN A1C: Hgb A1c MFr Bld: 15.9 — ABNORMAL HIGH

## 2011-04-22 DIAGNOSIS — I779 Disorder of arteries and arterioles, unspecified: Secondary | ICD-10-CM

## 2011-04-22 HISTORY — DX: Disorder of arteries and arterioles, unspecified: I77.9

## 2011-06-23 ENCOUNTER — Emergency Department (HOSPITAL_COMMUNITY): Payer: Medicare Other

## 2011-06-23 ENCOUNTER — Encounter (HOSPITAL_COMMUNITY): Payer: Self-pay

## 2011-06-23 ENCOUNTER — Emergency Department (HOSPITAL_COMMUNITY)
Admission: EM | Admit: 2011-06-23 | Discharge: 2011-06-24 | Disposition: A | Discharge status: Home / Self Care | Payer: Medicare Other | Attending: Emergency Medicine | Admitting: Emergency Medicine

## 2011-06-23 DIAGNOSIS — Z794 Long term (current) use of insulin: Secondary | ICD-10-CM | POA: Insufficient documentation

## 2011-06-23 DIAGNOSIS — I10 Essential (primary) hypertension: Secondary | ICD-10-CM | POA: Insufficient documentation

## 2011-06-23 DIAGNOSIS — M79609 Pain in unspecified limb: Secondary | ICD-10-CM | POA: Insufficient documentation

## 2011-06-23 DIAGNOSIS — M7989 Other specified soft tissue disorders: Secondary | ICD-10-CM | POA: Insufficient documentation

## 2011-06-23 DIAGNOSIS — E119 Type 2 diabetes mellitus without complications: Secondary | ICD-10-CM | POA: Insufficient documentation

## 2011-06-23 DIAGNOSIS — M79672 Pain in left foot: Secondary | ICD-10-CM

## 2011-06-23 DIAGNOSIS — S98139A Complete traumatic amputation of one unspecified lesser toe, initial encounter: Secondary | ICD-10-CM | POA: Insufficient documentation

## 2011-06-23 HISTORY — DX: Essential (primary) hypertension: I10

## 2011-06-23 LAB — GLUCOSE, CAPILLARY: Glucose-Capillary: 166 mg/dL — ABNORMAL HIGH (ref 70–99)

## 2011-06-23 NOTE — Discharge Instructions (Signed)
Your x-rays today do not show any specific concerning signs for infection in your bones. You may need additional testing including an MRI study for further evaluation. Please followup with your primary care provider and orthopedic specialist tomorrow as planned.  Pain of Unknown Etiology (Pain Without a Known Cause) You have come to your caregiver because of pain. Pain can occur in any part of the body. Often there is not a definite cause. If your laboratory (blood or urine) work was normal and x-rays or other studies were normal, your caregiver may treat you without knowing the cause of the pain. An example of this is the headache. Most headaches are diagnosed by taking a history. This means your caregiver asks you questions about your headaches. Your caregiver determines a treatment based on your answers. Usually testing done for headaches is normal. Often testing is not done unless there is no response to medications. Regardless of where your pain is located today, you can be given medications to make you comfortable. If no physical cause of pain can be found, most cases of pain will gradually leave as suddenly as they came.  If you have a painful condition and no reason can be found for the pain, It is importantthat you follow up with your caregiver. If the pain becomes worse or does not go away, it may be necessary to repeat tests and look further for a possible cause.  Only take over-the-counter or prescription medicines for pain, discomfort, or fever as directed by your caregiver.   For the protection of your privacy, test results can not be given over the phone. Make sure you receive the results of your test. Ask as to how these results are to be obtained if you have not been informed. It is your responsibility to obtain your test results.   You may continue all activities unless the activities cause more pain. When the pain lessens, it is important to gradually resume normal activities. Resume  activities by beginning slowly and gradually increasing the intensity and duration of the activities or exercise. During periods of severe pain, bed-rest may be helpful. Lay or sit in any position that is comfortable.   Ice used for acute (sudden) conditions may be effective. Use a large plastic bag filled with ice and wrapped in a towel. This may provide pain relief.   See your caregiver for continued problems. They can help or refer you for exercises or physical therapy if necessary.  If you were given medications for your condition, do not drive, operate machinery or power tools, or sign legal documents for 24 hours. Do not drink alcohol, take sleeping pills, or take other medications that may interfere with treatment. See your caregiver immediately if you have pain that is becoming worse and not relieved by medications. Document Released: 12/21/2000 Document Revised: 03/17/2011 Document Reviewed: 03/28/2005 [facility] Patient Information 2012 [place], [place].    RESOURCE GUIDE  Dental Problems  Patients with Medicaid: [facility] [phone] [address]                                           [phone] [address] Phone:  [phone]  Phone:  [phone]  If unable to pay or uninsured, contact:  [facility] or [facility]. to become qualified for the adult dental clinic.  Chronic Pain Problems Contact [facility]  [phone] Patients need to be referred by their primary care doctor.  Insufficient Money for Medicine Contact [employer] Way:  call "211" or [facility] 601-482-1717.  No Primary Care Doctor Call [facility]  445-085-4919 Other agencies that provide inexpensive medical care    [facility]  [phone]    [facility]  [phone]    [facility]  [phone]    [facility]  [phone]    [facility]   [phone]    [facility]  [phone]  Psychological Services [facility]  [phone] [facility] Services  [phone] [facility] Mental Health   [phone] (emergency services ([phone])  Substance Abuse Resources [facility]  (213)514-2801 [facility] ([phone] [facility] [place] (201)476-5296 Floydene Flock ([phone] Residential & Outpatient Substance Abuse Program  541-042-7225  Abuse/Neglect [facility] Child Abuse Hotline 479-272-7644 [facility] Child Abuse Hotline 479-435-1569 (After Hours)  Emergency Shelter [facility] Ministries 773-036-8159  Maternity Homes [facility] at [facility] [facility] 743-466-2464 [facility] [phone]  MRSA Hotline #:   321-032-2416    [facility] Resources  [facility]     [employer]                          [facility] Dept. [address] [place]                       [address]      [address] [place] [address]  Blondell Reveal Phone:  [phone]                                   Phone:  [phone]                 Phone:  [phone]  [facility] Mental Health Phone:  [phone]  [facility] Child Abuse Hotline 443-274-6081 [phone] (After Hours)

## 2011-06-23 NOTE — ED Provider Notes (Signed)
History     CSN: [id number]  Arrival date & time 06/23/11  1648   First MD Initiated Contact with Patient 06/23/11 2104      Chief Complaint  Patient presents with  . Foot Pain    ulcers from DM     HPI  History provided by the patient. Patient is a 41 year old African American male with history of hypertension and diabetes with prior left toe amputations who presents with complaints and concerns for increasing pressure and swelling in left foot. Patient states that symptoms are beginning to feel similar to previous infections he's had in the past. He was seen by his PCP, Dr. Dareen Piano earlier today who recommended he come to the emergency room for additional evaluation of the foot. Patient also reports that he has appointment with orthopedic specialist tomorrow for same concerns. He denies any other associated symptoms. Reports only mild pains and foot. Pain is increased with walking and pressure. He denies any erythema or erythematous streaks. No open sores, bleeding or discharge. He denies any fever, chills, sweats. Denies any other aggravating or alleviating factors. Patient has history of poorly controlled diabetes. Reports having increase in his insulin medications today. His last sugar today was 270. Patient also reports A1c around 12.    Past Medical History  Diagnosis Date  . Diabetes mellitus   . Hypertension     Past Surgical History  Procedure Date  . Cholecystectomy   . Toe amputation     No family history on file.  History  Substance Use Topics  . Smoking status: Not on file  . Smokeless tobacco: Not on file  . Alcohol Use: Yes      Review of Systems  Constitutional: Negative for fever and chills.  All other systems reviewed and are negative.    Allergies  Review of patient's allergies indicates no known allergies.  Home Medications   Current Outpatient Rx  Name Route Sig Dispense Refill  . AMLODIPINE BESYLATE 5 MG PO TABS Oral Take 5 mg by mouth  daily.    . INSULIN ASPART 100 UNIT/ML [place] SOLN Subcutaneous Inject 12-20 Units into the skin See admin instructions. Inject 12 to 20 units between meals 3 times daily.    . INSULIN GLARGINE 100 UNIT/ML  SOLN Subcutaneous Inject 40-60 Units into the skin See admin instructions. Sliding scale 40 to 60 units nightly at bedtime.    Marland Kitchen METOPROLOL SUCCINATE ER 50 MG PO TB24 Oral Take 50 mg by mouth daily. Take with or immediately following a meal.      BP 170/105  Pulse 101  Temp(Src) 98.1 F (36.7 C) (Oral)  Resp 20  SpO2 100%  Physical Exam  Nursing note and vitals reviewed. Constitutional: He is oriented to person, place, and time. He appears well-developed and well-nourished. No distress.  HENT:  Head: Normocephalic.  Cardiovascular: Normal rate and regular rhythm.   Pulmonary/Chest: Effort normal and breath sounds normal. No respiratory distress.  Abdominal: Soft. He exhibits no distension. There is no tenderness.  Musculoskeletal:       Amputations of left toes except the fourth digit. Moderate swelling of left foot and lower leg. Dry and scaling skin over leg and foot. There is increased warmth over the anterior medial shin with ulceration of skin.  Neurological: He is alert and oriented to person, place, and time.  Skin: Skin is warm.       See extremity exam  Psychiatric: He has a normal mood and affect. His behavior  is normal.    ED Course  Procedures   Dg Foot Complete Left  06/23/2011  *RADIOLOGY REPORT*  Clinical Data: Left foot pain, history of osteomyelitis and gout.  LEFT FOOT - COMPLETE 3+ VIEW  Comparison: 06/07/2010 MRI, 06/06/2010 radiograph  Findings: Multiple toe amputations again noted, with interval amputation of the first metatarsal.  Progressive osteolysis of the proximal metatarsals and the tarsal bones in the interval.  There is bony destruction involving the cuboids, cuneiform, and to a lesser extent the navicular and talus along the talonavicular joint.  Pes  planus again noted.  Prominent posterior and plantar calcaneal enthesiophyte.  Atherosclerotic vascular calcifications noted.  IMPRESSION: Progressive osteolysis involving the midfoot.  Differential includes osteomyelitis and neuropathic arthropathy.  Original Report Authenticated By: Waneta Martins, M.D.     1. Foot pain, left       MDM  9:10 PM patient seen and evaluated. Patient in no acute distress.  Pt discussed with Attending Physician.  Plan have patient followup with orthopedics tomorrow for further evaluation and treatment.      Angus Seller, [place] 06/24/11 [phone]

## 2011-06-23 NOTE — ED Notes (Signed)
Pt c/o pain in lt foot x3-4 days, states hx of ulcers to lt foot, hx of noncompliant diabetes, states less sensation to lt foot

## 2011-06-24 NOTE — ED Provider Notes (Signed)
Medical screening examination/treatment/procedure(s) were performed by non-physician practitioner and as supervising physician I was immediately available for consultation/collaboration.   Bayler Nehring M English Tomer, MD 06/24/11 1321 

## 2011-09-04 ENCOUNTER — Emergency Department (HOSPITAL_COMMUNITY): Payer: Medicare Other

## 2011-09-04 ENCOUNTER — Emergency Department (HOSPITAL_COMMUNITY)
Admission: EM | Admit: 2011-09-04 | Discharge: 2011-09-04 | Disposition: A | Discharge status: Home / Self Care | Payer: Medicare Other | Attending: Emergency Medicine | Admitting: Emergency Medicine

## 2011-09-04 ENCOUNTER — Encounter (HOSPITAL_COMMUNITY): Payer: Self-pay | Admitting: *Deleted

## 2011-09-04 DIAGNOSIS — X58XXXA Exposure to other specified factors, initial encounter: Secondary | ICD-10-CM | POA: Insufficient documentation

## 2011-09-04 DIAGNOSIS — T148XXA Other injury of unspecified body region, initial encounter: Secondary | ICD-10-CM

## 2011-09-04 DIAGNOSIS — IMO0002 Reserved for concepts with insufficient information to code with codable children: Secondary | ICD-10-CM | POA: Insufficient documentation

## 2011-09-04 NOTE — ED Notes (Signed)
Patient has a blister on the left foot.  Patient states he has had the areas for 2 days.  Patient wants the area checked out.  He is a diabetic.

## 2011-09-04 NOTE — ED Provider Notes (Signed)
History   This chart was scribed for Lyanne Co, MD by Toya Smothers. The patient was seen in room STRE4/STRE4. Patient's care was started at 1310.  CSN: [id number]  Arrival date & time 09/04/11  1310   First MD Initiated Contact with Patient 09/04/11 1449      Chief Complaint  Patient presents with  . Blister   HPI  [name] is a 41 y.o. male who presents to the Emergency Department complaining of gradual onset moderate sever constant blister on the left side of right foot. Pt states that he is concerned that infection may be present. Pt has a h/o hypertension, diabetes, and has had a right toe amputation.  He denies fevers or chills.  He denies redness warmth or spreading erythema.  There is no pain at this time  Pt List PCP as Dr. Elizabeth Palau  Past Medical History  Diagnosis Date  . Diabetes mellitus   . Hypertension     Past Surgical History  Procedure Date  . Cholecystectomy   . Toe amputation     No family history on file.  History  Substance Use Topics  . Smoking status: Not on file  . Smokeless tobacco: Not on file  . Alcohol Use: Yes    Review of Systems  Constitutional: Negative for fever and chills.  Respiratory: Negative for shortness of breath.   Gastrointestinal: Negative for nausea and vomiting.  Skin:       Blister on the right foot  Neurological: Negative for weakness.    Allergies  Review of patient's allergies indicates no known allergies.  Home Medications   Current Outpatient Rx  Name Route Sig Dispense Refill  . ALLOPURINOL 100 MG PO TABS Oral Take 100 mg by mouth daily.    Marland Kitchen AMLODIPINE BESYLATE 5 MG PO TABS Oral Take 5 mg by mouth daily.    . FUROSEMIDE 20 MG PO TABS Oral Take 20 mg by mouth daily.    . INSULIN ASPART 100 UNIT/ML [place] SOLN Subcutaneous Inject 12-15 Units into the skin See admin instructions. Inject 12 to 15 units between meals 3 times daily.    . INSULIN GLARGINE 100 UNIT/ML [place] SOLN Subcutaneous Inject 45-48  Units into the skin See admin instructions. Sliding scale 45 to 48 units nightly at bedtime.    Marland Kitchen METOPROLOL SUCCINATE ER 50 MG PO TB24 Oral Take 50 mg by mouth daily. Take with or immediately following a meal.      BP 177/101  Pulse 90  Temp(Src) 97.8 F (36.6 C) (Oral)  Resp 18  Ht 5\' 8"  (1.727 m)  Wt 250 lb (113.399 kg)  BMI 38.01 kg/m2  SpO2 98%  Physical Exam  Nursing note and vitals reviewed. Constitutional: He is oriented to person, place, and time. He appears well-developed and well-nourished. No distress.  HENT:  Head: Normocephalic and atraumatic.  Eyes: EOM are normal.  Neck: Neck supple. No tracheal deviation present.  Cardiovascular: Normal rate.   Pulmonary/Chest: Effort normal. No respiratory distress.  Musculoskeletal: Normal range of motion.       Small dime size blister on the  lateral aspect of the left foot, with no erythema.  There is no tenderness fluctuance or induration.  There is no spreading erythema.  There appears to be serous fluid in the blister  Neurological: He is alert and oriented to person, place, and time.  Skin: Skin is warm and dry.  Psychiatric: He has a normal mood and affect. His behavior  is normal.    ED Course  Procedures (including critical care time)  DIAGNOSTIC STUDIES: Oxygen Saturation is 98% on room air, normal by my interpretation.    COORDINATION OF CARE:    Labs Reviewed - No data to display Dg Foot Complete Left  09/04/2011  *RADIOLOGY REPORT*  Clinical Data: Soft tissue blister on the posterior lateral aspect of the left foot.  Prior partial amputations of the foot.  LEFT FOOT - COMPLETE 3+ VIEW  Comparison: 06/23/2011  Findings: There is either destruction or previous surgical resection of the lateral aspect of the distal phalangeal bone of the little toe.  I suspect this represents osteomyelitis and the appearance is new since the prior study.  There are extensive chronic changes in the hindfoot and midfoot which are  stable.  First and second rays have been resected.  IMPRESSION:  1.  Probable osteomyelitis of the distal phalangeal bone of the little toe. 2.  Stable appearance of the remainder of the foot.  Original Report Authenticated By: Gwynn Burly, M.D.     1. Blister       MDM  The patient requests an x-ray.  There is possible changes of osteomyelitis of his distal phalangeal bone of the little toe.  There is no bone changes underlying the blister.  At this time there is no secondary signs of infection of the blister.  He does need very close orthopedic followup for which I recommended he call his orthopedic surgeon in 2 days.  He understands to return to the emergency department for signs of infection that we discussed at the bedside.  He is agreeable to outpatient plan  I personally performed the services described in this documentation, which was scribed in my presence. The recorded information has been reviewed and considered.       Lyanne Co, MD 09/04/11 [phone]

## 2012-02-17 ENCOUNTER — Encounter (HOSPITAL_COMMUNITY): Payer: Self-pay | Admitting: Emergency Medicine

## 2012-02-17 ENCOUNTER — Emergency Department (HOSPITAL_COMMUNITY)
Admission: EM | Admit: 2012-02-17 | Discharge: 2012-02-18 | Disposition: A | Discharge status: Home / Self Care | Payer: Medicare Other | Attending: Emergency Medicine | Admitting: Emergency Medicine

## 2012-02-17 DIAGNOSIS — L97509 Non-pressure chronic ulcer of other part of unspecified foot with unspecified severity: Secondary | ICD-10-CM | POA: Insufficient documentation

## 2012-02-17 DIAGNOSIS — E11621 Type 2 diabetes mellitus with foot ulcer: Secondary | ICD-10-CM

## 2012-02-17 DIAGNOSIS — I10 Essential (primary) hypertension: Secondary | ICD-10-CM | POA: Insufficient documentation

## 2012-02-17 DIAGNOSIS — E1169 Type 2 diabetes mellitus with other specified complication: Secondary | ICD-10-CM | POA: Insufficient documentation

## 2012-02-17 DIAGNOSIS — Y939 Activity, unspecified: Secondary | ICD-10-CM | POA: Insufficient documentation

## 2012-02-17 DIAGNOSIS — X58XXXA Exposure to other specified factors, initial encounter: Secondary | ICD-10-CM | POA: Insufficient documentation

## 2012-02-17 DIAGNOSIS — Z794 Long term (current) use of insulin: Secondary | ICD-10-CM | POA: Insufficient documentation

## 2012-02-17 DIAGNOSIS — Y929 Unspecified place or not applicable: Secondary | ICD-10-CM | POA: Insufficient documentation

## 2012-02-17 DIAGNOSIS — Z79899 Other long term (current) drug therapy: Secondary | ICD-10-CM | POA: Insufficient documentation

## 2012-02-17 LAB — CBC WITH DIFFERENTIAL/PLATELET
Basophils Absolute: 0 10*3/uL (ref 0.0–0.1)
Basophils Relative: 0 % (ref 0–1)
HCT: 36.7 % — ABNORMAL LOW (ref 39.0–52.0)
Lymphocytes Relative: 49 % — ABNORMAL HIGH (ref 12–46)
MCHC: 34.6 g/dL (ref 30.0–36.0)
Monocytes Absolute: 0.4 10*3/uL (ref 0.1–1.0)
Neutro Abs: 3.4 10*3/uL (ref 1.7–7.7)
Neutrophils Relative %: 44 % (ref 43–77)
Platelets: 259 10*3/uL (ref 150–400)
RDW: 12.9 % (ref 11.5–15.5)
WBC: 7.7 10*3/uL (ref 4.0–10.5)

## 2012-02-17 LAB — GLUCOSE, CAPILLARY: Glucose-Capillary: 259 mg/dL — ABNORMAL HIGH (ref 70–99)

## 2012-02-17 MED ORDER — SODIUM CHLORIDE 0.9 % IV BOLUS (SEPSIS)
1000.0000 mL | Freq: Once | INTRAVENOUS | Status: AC
  Administered 2012-02-17: 1000 mL via INTRAVENOUS

## 2012-02-17 NOTE — ED Provider Notes (Addendum)
History     CSN: [id number]  Arrival date & time 02/17/12  1847   First MD Initiated Contact with Patient 02/17/12 2259      Chief Complaint  Patient presents with  . Foot Injury    (Consider location/radiation/quality/duration/timing/severity/associated sxs/prior treatment) HPI 41 year old male presents to emergency room complaining of acute on chronic foot ulcer. Patient is a diabetic has history of hypertension. Patient reports he feels that a blister 34 on the side of his foot has tunneled under, causing an ulceration. Patient was seen at outside hospital, [facility], on Wednesday. He is upset with their facility that they did not do enough testing. He had x-ray done, was told there was no bone infection. Patient has followup on Monday with his orthopedist. He has had prior toe amputations. Patient reports he's had 3 days of foul drainage from the wound. He denies any fevers, no erythema, no increased tenderness to the area. Patient is not currently on any antibiotics. He reports he has been taking his medications as prescribed. He reports his sugars have been running in the 200-300 range.   Past Medical History  Diagnosis Date  . Diabetes mellitus   . Hypertension     Past Surgical History  Procedure Date  . Cholecystectomy   . Toe amputation     History reviewed. No pertinent family history.  History  Substance Use Topics  . Smoking status: Not on file  . Smokeless tobacco: Not on file  . Alcohol Use: Yes      Review of Systems  All other systems reviewed and are negative.  other than listed in HPI  Allergies  Review of patient's allergies indicates no known allergies.  Home Medications   Current Outpatient Rx  Name  Route  Sig  Dispense  Refill  . ALLOPURINOL 100 MG PO TABS   Oral   Take 100 mg by mouth daily.         Marland Kitchen AMLODIPINE BESYLATE 5 MG PO TABS   Oral   Take 5 mg by mouth daily.         . FUROSEMIDE 20 MG PO TABS   Oral   Take 20 mg by  mouth daily.         . INSULIN ASPART 100 UNIT/ML [place] SOLN   Subcutaneous   Inject 15 Units into the skin 3 (three) times daily before meals.          . INSULIN GLARGINE 100 UNIT/ML [place] SOLN   Subcutaneous   Inject 50 Units into the skin at bedtime.          Marland Kitchen METOPROLOL SUCCINATE ER 50 MG PO TB24   Oral   Take 50 mg by mouth daily.            BP 190/102  Pulse 104  Temp 98.3 F (36.8 C) (Oral)  Resp 18  SpO2 96%  Physical Exam  Nursing note and vitals reviewed. Constitutional: He is oriented to person, place, and time. He appears well-developed and well-nourished. No distress.  HENT:  Head: Normocephalic and atraumatic.  Nose: Nose normal.  Mouth/Throat: Oropharynx is clear and moist.  Neck: Normal range of motion. Neck supple. No JVD present. No tracheal deviation present. No thyromegaly present.  Cardiovascular: Regular rhythm, normal heart sounds and intact distal pulses.  Exam reveals no gallop and no friction rub.   No murmur heard. tachycardia  Pulmonary/Chest: Effort normal and breath sounds normal. No stridor. No respiratory distress. He has no wheezes.  He has no rales. He exhibits no tenderness.  Abdominal: Soft. Bowel sounds are normal. He exhibits no distension and no mass. There is no tenderness. There is no rebound and no guarding.  Musculoskeletal: He exhibits no edema and no tenderness.       Chronic appearing ulcer to lateral aspect of left foot, no erythema, no active drainage, no fluctuance.  Area of ulcer is dry, firm, with flaking of outer layers of skin  Lymphadenopathy:    He has no cervical adenopathy.  Neurological: He is alert and oriented to person, place, and time.  Skin: Skin is warm and dry. No rash noted. No erythema. No pallor.    ED Course  Procedures (including critical care time)  Labs Reviewed  CBC WITH DIFFERENTIAL - Abnormal; Notable for the following:    Hemoglobin 12.7 (*)     HCT 36.7 (*)     Lymphocytes Relative 49 (*)      All other components within normal limits  BASIC METABOLIC PANEL - Abnormal; Notable for the following:    Sodium 133 (*)     Glucose, Bld 306 (*)     BUN 34 (*)     GFR calc non Af Amer 63 (*)     GFR calc Af Amer 73 (*)     All other components within normal limits  SEDIMENTATION RATE - Abnormal; Notable for the following:    Sed Rate 62 (*)     All other components within normal limits  GLUCOSE, CAPILLARY - Abnormal; Notable for the following:    Glucose-Capillary 259 (*)     All other components within normal limits  GLUCOSE, CAPILLARY - Abnormal; Notable for the following:    Glucose-Capillary 241 (*)     All other components within normal limits  CULTURE, BLOOD (ROUTINE X 2)  CULTURE, BLOOD (ROUTINE X 2)   No results found.   1. Diabetic foot ulcer associated with type 2 diabetes mellitus       MDM  41 yo male with diabetic foot ulcer.  Pt without fever, is noted to be htn, tachycardic, elevated blood sugar.  Will get baseline labs, check blood cx and sed rate.  No indication for bedside i and d at this time.  Pt reports he has f/u on Monday with his orthopedic surgeon.        Olivia Mackie, MD 02/18/12 4098  Olivia Mackie, MD 05/23/12 [phone]

## 2012-02-17 NOTE — ED Notes (Signed)
CBG 259 RN notified

## 2012-02-17 NOTE — ED Notes (Addendum)
Pt c/o left foot chronic wound that pt thinks is infected due to foul smelling drainage from wound x 1 week; pt has xray with him

## 2012-02-18 LAB — BASIC METABOLIC PANEL
CO2: 24 mEq/L (ref 19–32)
Chloride: 98 mEq/L (ref 96–112)
Creatinine, Ser: 1.35 mg/dL (ref 0.50–1.35)
GFR calc Af Amer: 73 mL/min — ABNORMAL LOW (ref >90)
Potassium: 4.5 mEq/L (ref 3.5–5.1)
Sodium: 133 mEq/L — ABNORMAL LOW (ref 135–145)

## 2012-02-18 LAB — GLUCOSE, CAPILLARY: Glucose-Capillary: 241 mg/dL — ABNORMAL HIGH (ref 70–99)

## 2012-02-18 LAB — SEDIMENTATION RATE: Sed Rate: 62 mm/hr — ABNORMAL HIGH (ref 0–16)

## 2012-02-18 MED ORDER — CEPHALEXIN 500 MG PO CAPS: 500.0000 mg | ORAL_CAPSULE | Freq: Four times a day (QID) | ORAL | Status: DC

## 2012-02-18 MED ORDER — CEPHALEXIN 250 MG PO CAPS
500.0000 mg | ORAL_CAPSULE | Freq: Once | ORAL | Status: AC
  Administered 2012-02-18: 500 mg via ORAL
  Filled 2012-02-18: qty 2

## 2012-02-18 MED ORDER — CLINDAMYCIN HCL 300 MG PO CAPS
300.0000 mg | ORAL_CAPSULE | Freq: Once | ORAL | Status: AC
  Administered 2012-02-18: 300 mg via ORAL
  Filled 2012-02-18: qty 1

## 2012-02-18 MED ORDER — CLINDAMYCIN HCL 300 MG PO CAPS: 300.0000 mg | ORAL_CAPSULE | Freq: Three times a day (TID) | ORAL | Status: DC

## 2012-02-18 MED ORDER — SODIUM CHLORIDE 0.9 % IV BOLUS (SEPSIS)
1000.0000 mL | Freq: Once | INTRAVENOUS | Status: AC
  Administered 2012-02-18: 1000 mL via INTRAVENOUS

## 2012-02-18 NOTE — ED Notes (Signed)
PHARMACIST NOTIFIED TO SEND PT'S CLINDAMYCIN  ORDER.

## 2012-02-24 LAB — CULTURE, BLOOD (ROUTINE X 2): Culture: NO GROWTH

## 2012-03-02 IMAGING — CR DG FOOT COMPLETE 3+V*L*
3 series · 3 of 3 positions shown · non-contrast
Comparison: 12/16/2008

CLINICAL DATA: Left foot soft tissue ulceration

LEFT FOOT - COMPLETE 3+ VIEW

[t foot ap left]
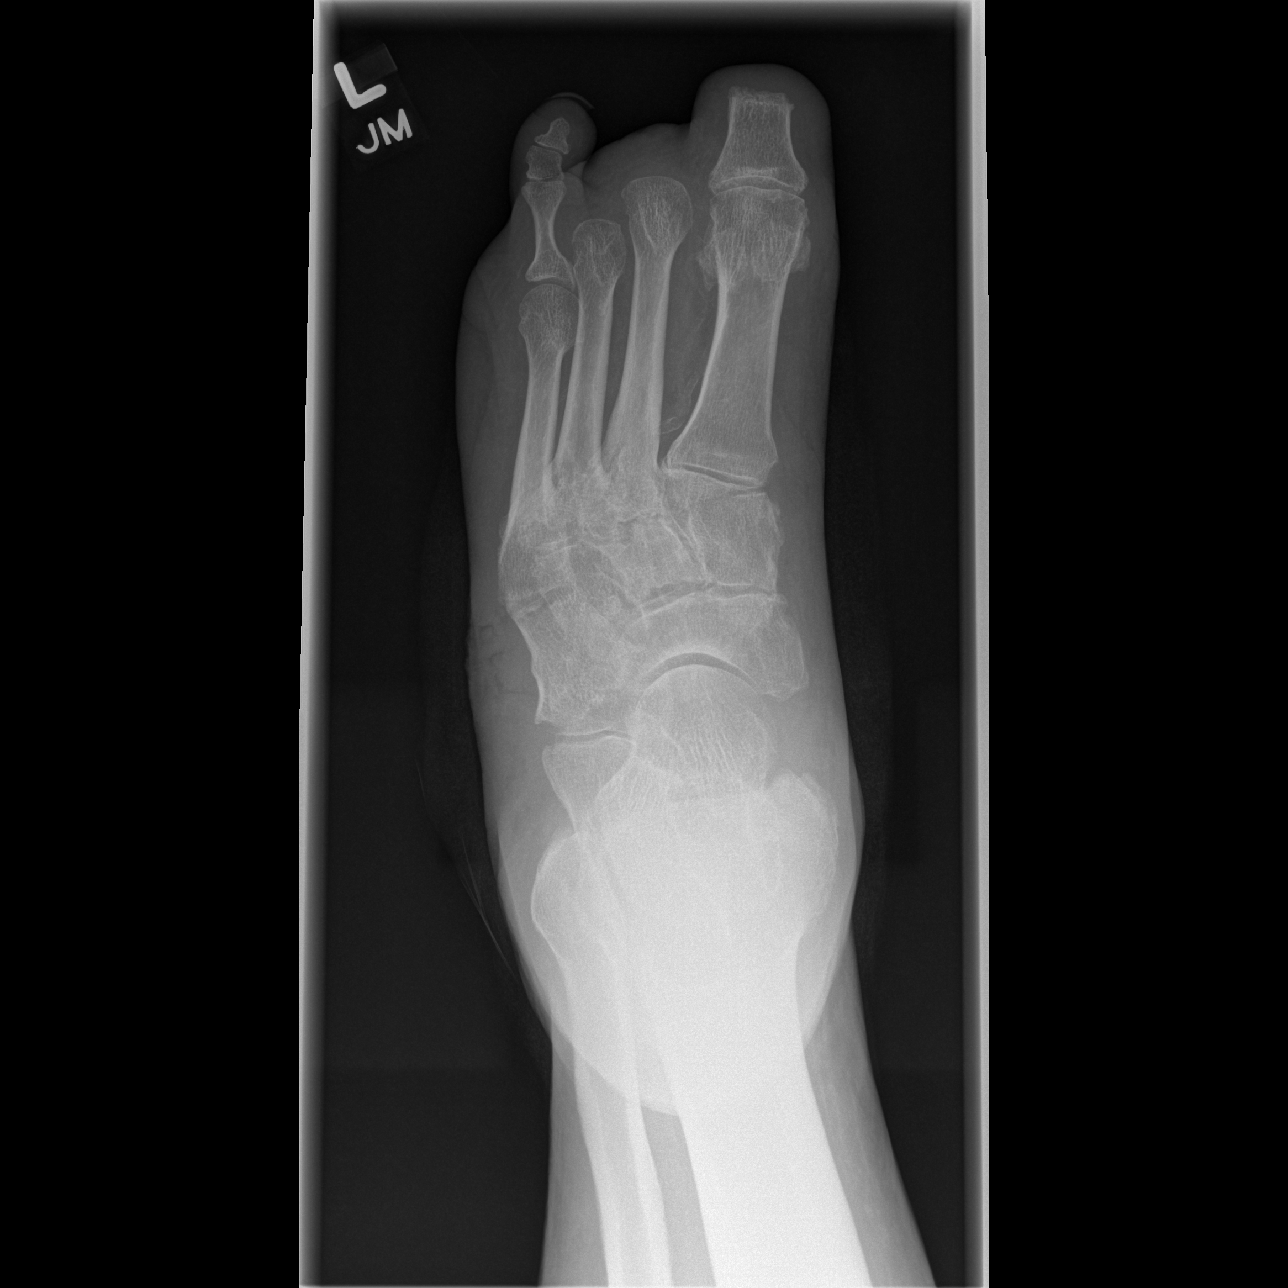

[t foot oblique left *]
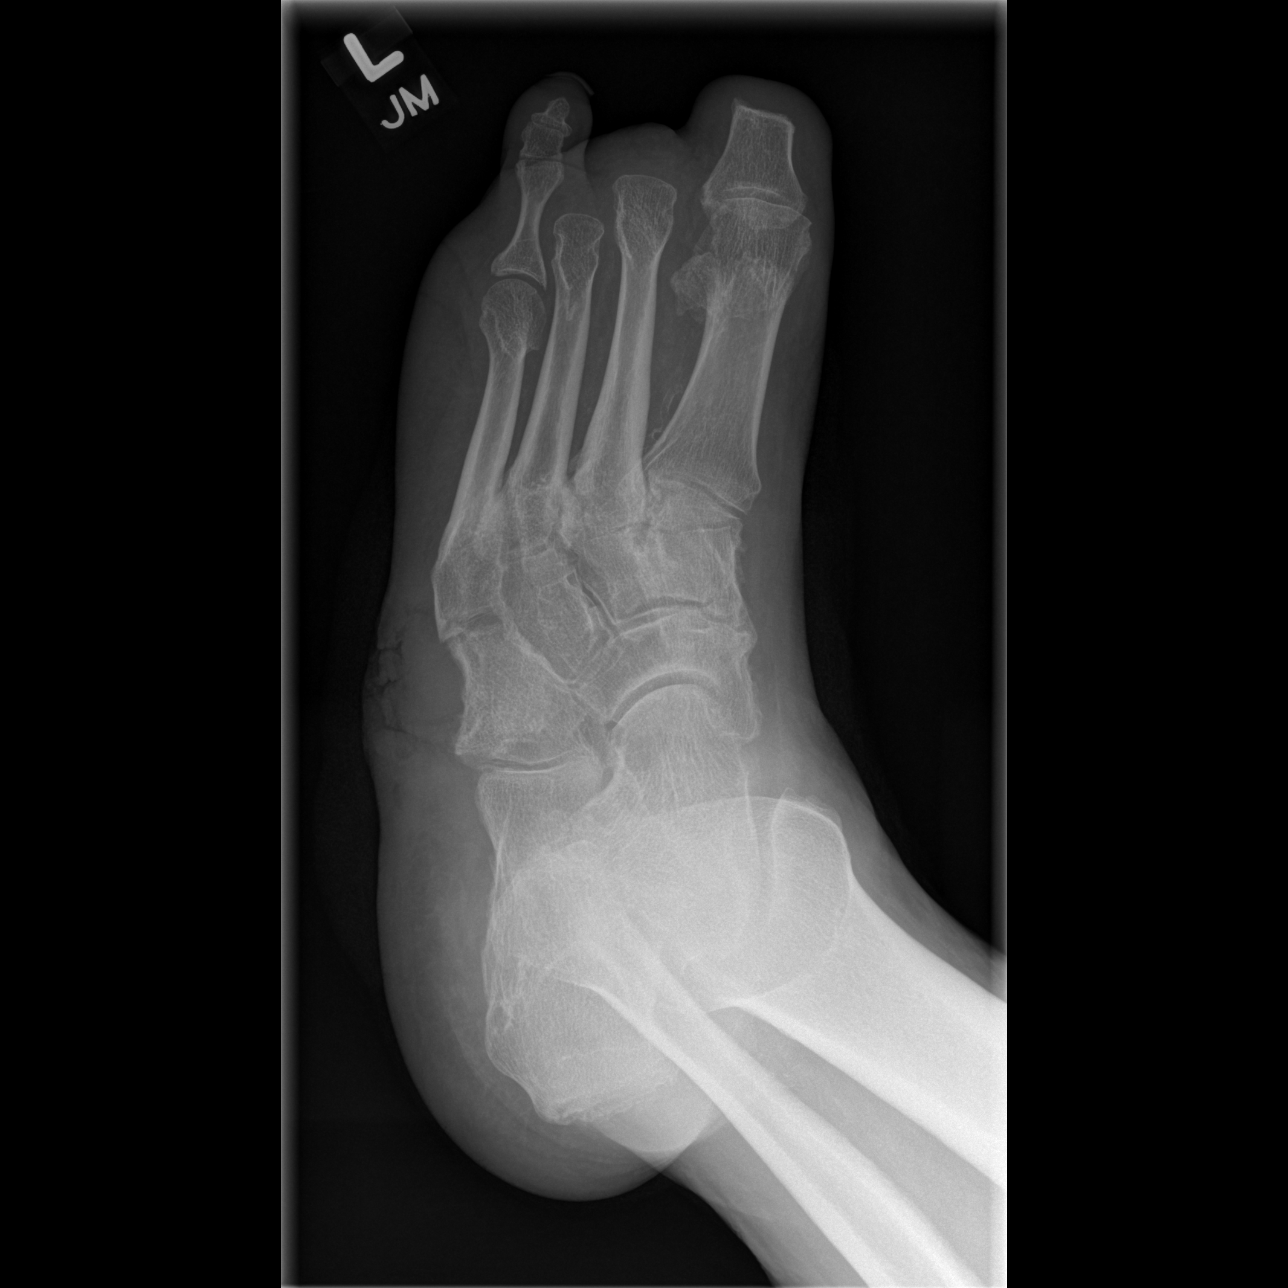

[t foot lat left *]
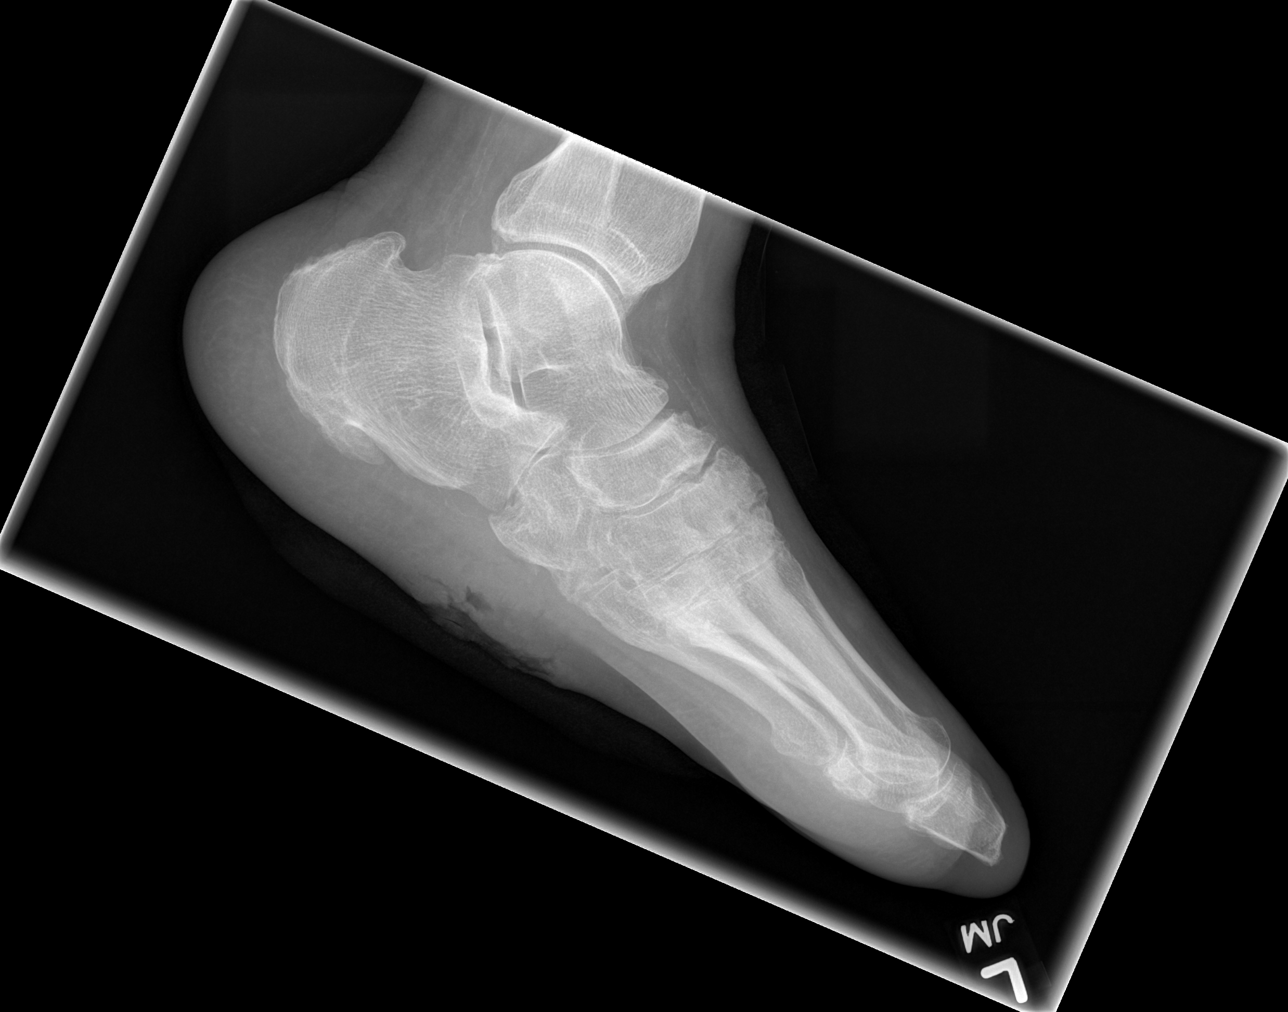

[3 of 3 positions shown; findings below may reference images not displayed]

FINDINGS: The patient has undergone amputation of the distal aspect
of the great toe, the phalanges of the second and third toes, and
the fifth right ray.  Vascular calcifications are identified.
Degenerative changes at the Lisfranc joint are noted.  Soft tissue
ulceration noted over the plantar aspect of the foot without
underlying bony destruction. No radiopaque foreign body.
IMPRESSION: No definite plain film evidence for osteomyelitis underlying
plantar soft tissue ulcer.

Postsurgical changes as above with neuropathic - type degenerative
changes at the mid foot.

## 2013-01-21 IMAGING — CT CT HEAD W/O CM
1 of 2 series · 16 of 30 positions shown, 20 images · non-contrast
Comparison: None.

CLINICAL DATA: Headache.

CT HEAD WITHOUT CONTRAST
TECHNIQUE: Contiguous axial images were obtained from the base of
the skull through the vertex without contrast.

[Series 2: (id) head 4.8 h37s st · axial · 0.52mm/px · z∈[-70,+93]mm · 16 of 36 slices shown, 20 images]
[im 2/36  brain]
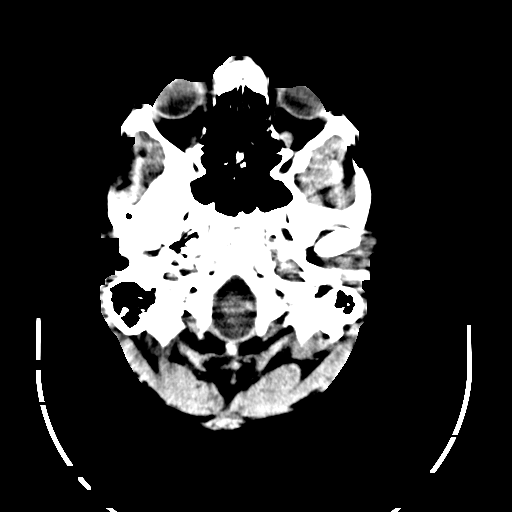
[im 2/36  bone]
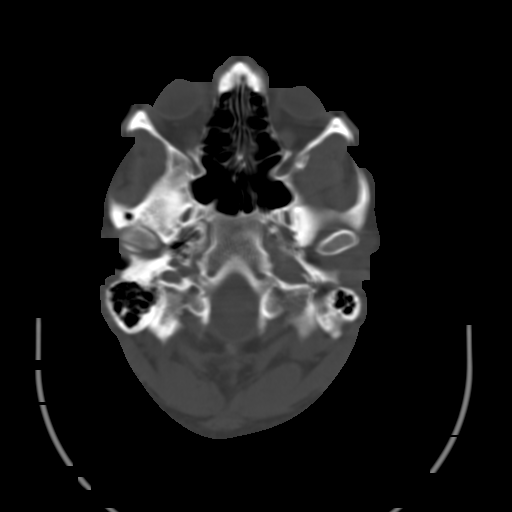
[im 4/36  brain]
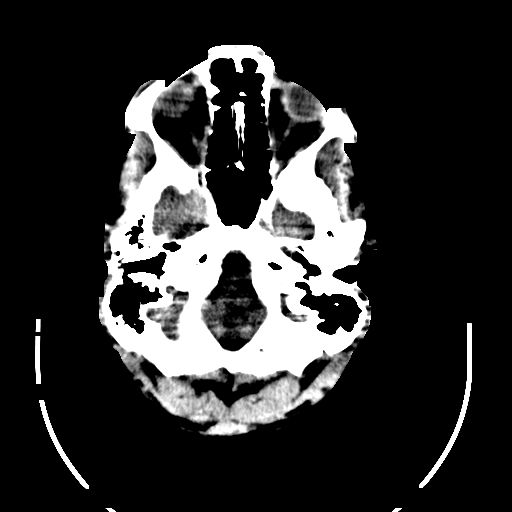
[im 6/36  brain]
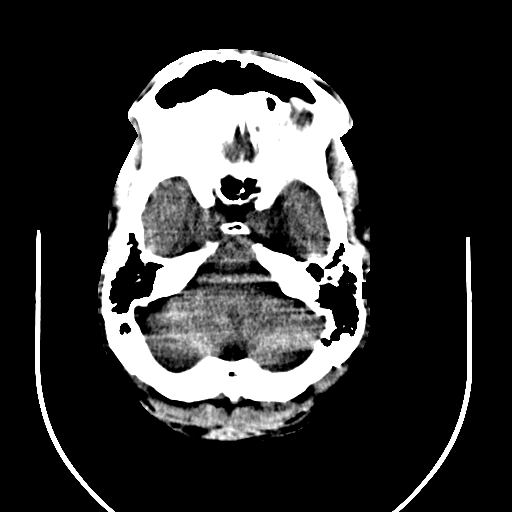
[im 8/36  brain]
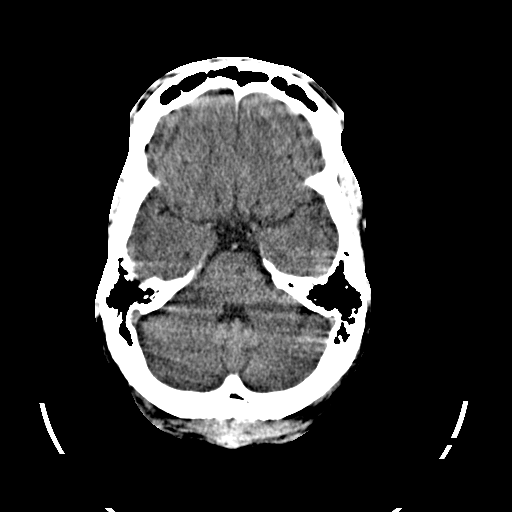
[im 12/36  brain]
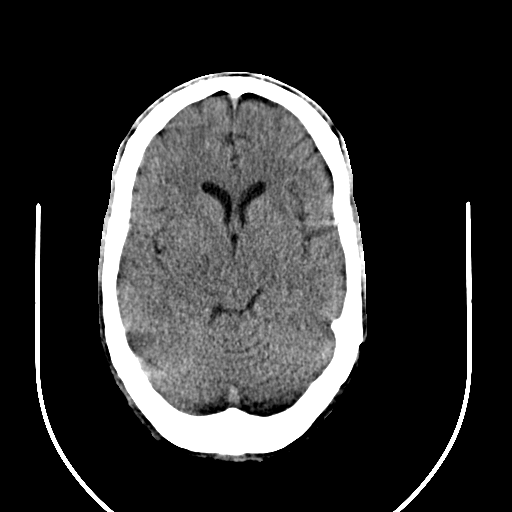
[im 12/36  bone]
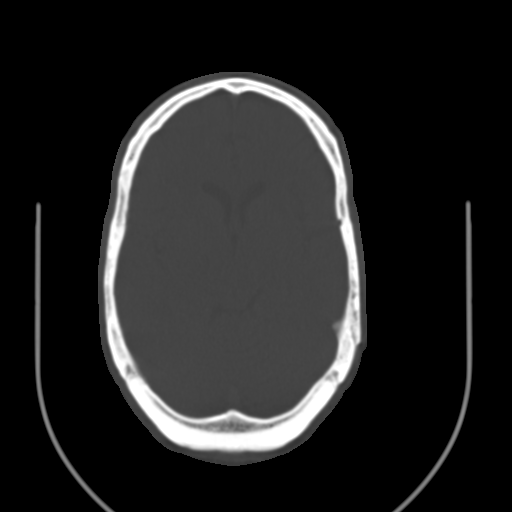
[im 13/36  brain]
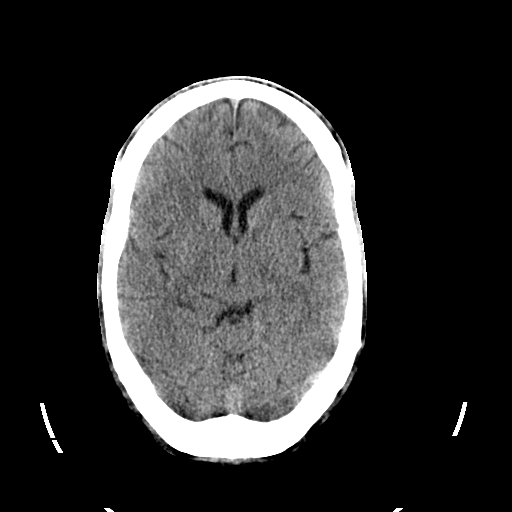
[im 15/36  brain]
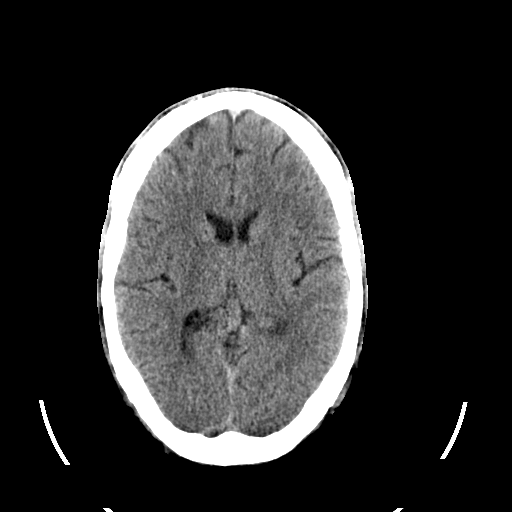
[im 17/36  brain]
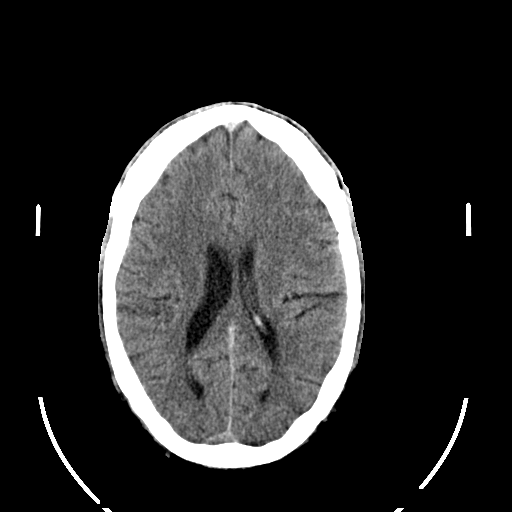
[im 19/36  brain]
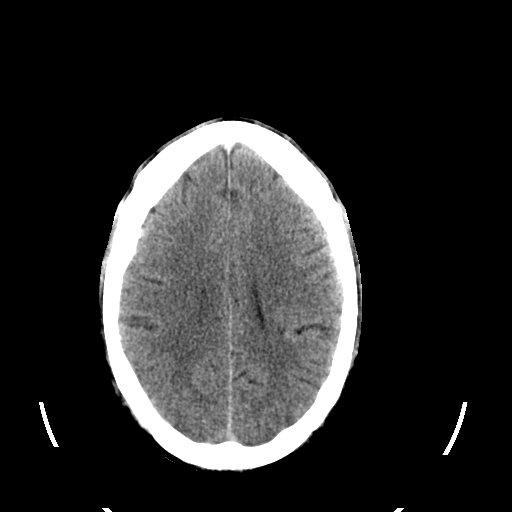
[im 19/36  bone]
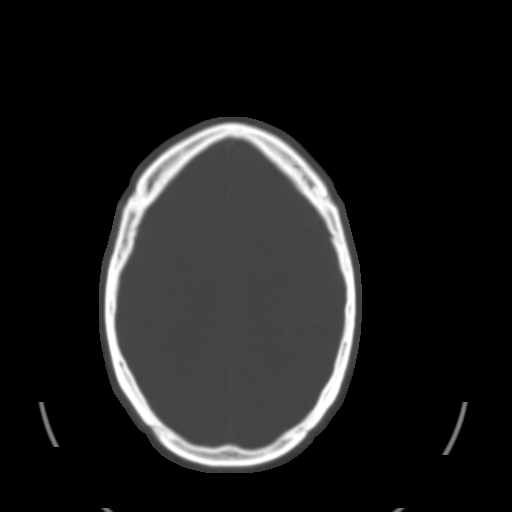
[im 21/36  brain]
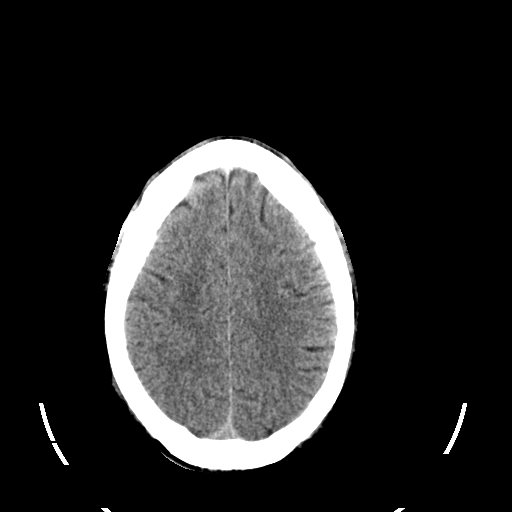
[im 23/36  brain]
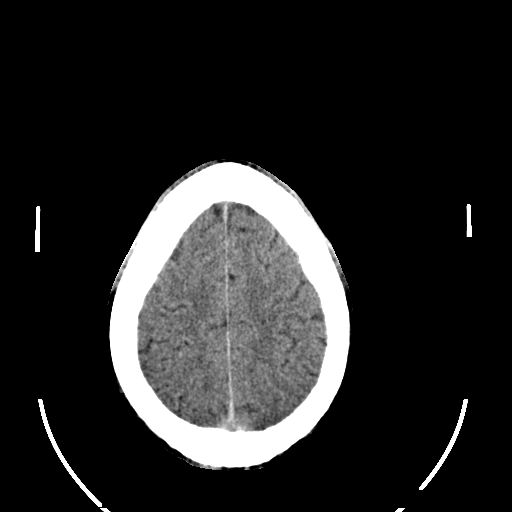
[im 24/36  brain]
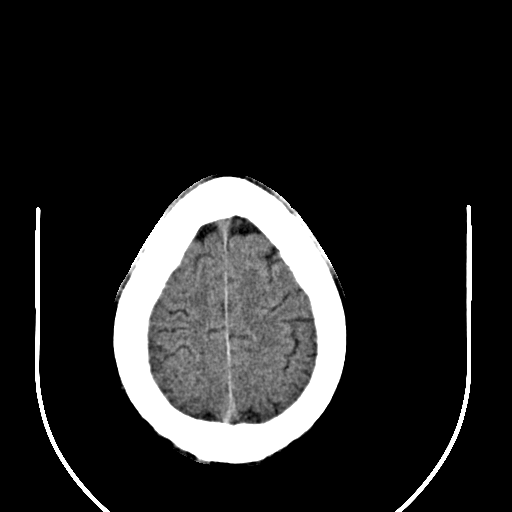
[im 28/36  brain]
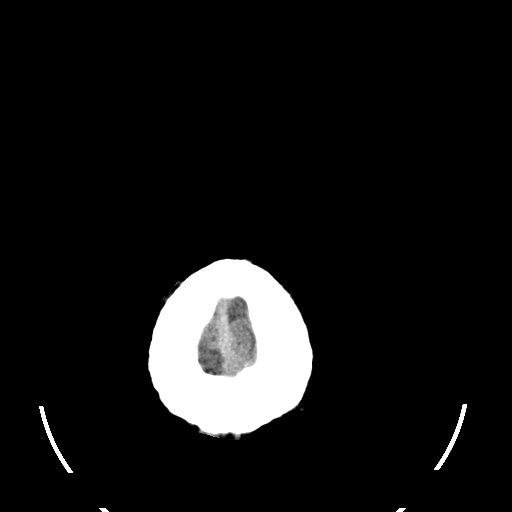
[im 28/36  bone]
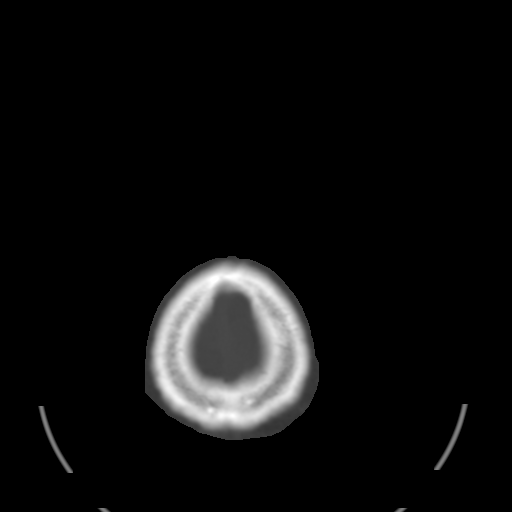
[im 30/36  brain]
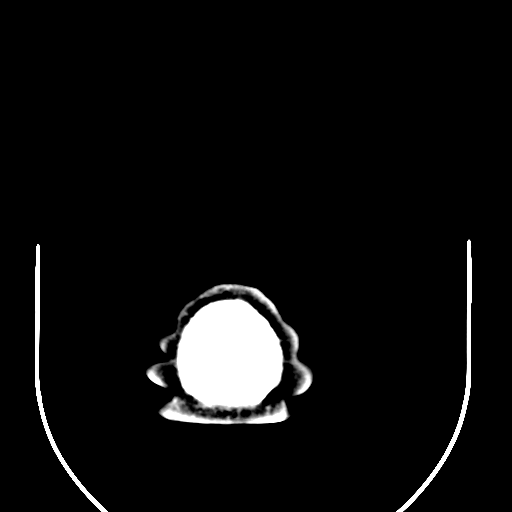
[im 32/36  brain]
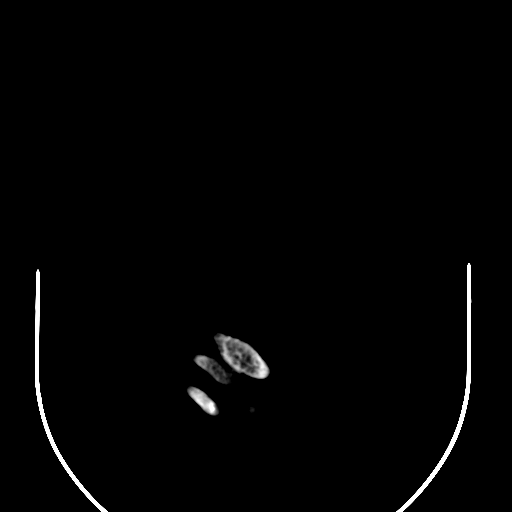
[im 34/36  brain]
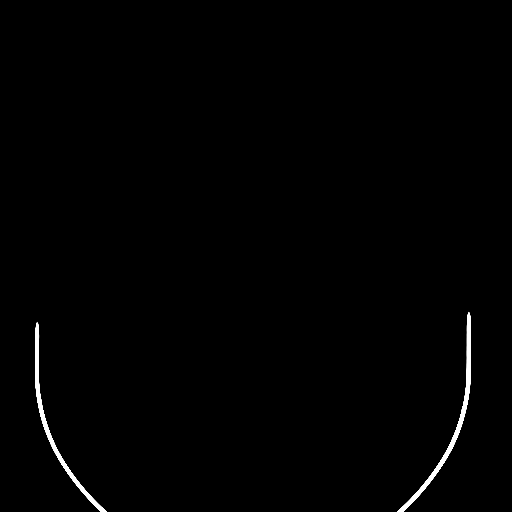

[16 of 30 positions shown; findings below may reference images not displayed]

FINDINGS: There is no evidence of acute intracranial abnormality
including acute infarction, hemorrhage, mass lesion, mass effect,
midline shift or abnormal extra-axial fluid collection.  There is
no hydrocephalus.  The calvarium is intact.  Imaged paranasal
sinuses and mastoid air cells are clear.
IMPRESSION: Negative exam.

## 2014-03-14 DIAGNOSIS — E1139 Type 2 diabetes mellitus with other diabetic ophthalmic complication: Secondary | ICD-10-CM

## 2014-03-14 DIAGNOSIS — M79605 Pain in left leg: Secondary | ICD-10-CM | POA: Insufficient documentation

## 2014-03-14 DIAGNOSIS — M109 Gout, unspecified: Secondary | ICD-10-CM

## 2014-03-14 DIAGNOSIS — IMO0002 Reserved for concepts with insufficient information to code with codable children: Secondary | ICD-10-CM

## 2014-03-14 DIAGNOSIS — K219 Gastro-esophageal reflux disease without esophagitis: Secondary | ICD-10-CM | POA: Insufficient documentation

## 2014-03-14 DIAGNOSIS — M14672 Charcot's joint, left ankle and foot: Secondary | ICD-10-CM

## 2014-03-14 HISTORY — DX: Gout, unspecified: M10.9

## 2014-03-14 HISTORY — DX: Charcot's joint, left ankle and foot: M14.672

## 2014-03-14 HISTORY — DX: Type 2 diabetes mellitus with other diabetic ophthalmic complication: E11.39

## 2014-03-14 HISTORY — DX: Pain in left leg: M79.605

## 2014-03-14 HISTORY — DX: Reserved for concepts with insufficient information to code with codable children: IMO0002

## 2014-03-14 HISTORY — DX: Gastro-esophageal reflux disease without esophagitis: K21.9

## 2014-03-15 DIAGNOSIS — N183 Chronic kidney disease, stage 3 unspecified: Secondary | ICD-10-CM | POA: Insufficient documentation

## 2014-03-15 HISTORY — DX: Chronic kidney disease, stage 3 unspecified: N18.30

## 2014-04-15 DIAGNOSIS — I70209 Unspecified atherosclerosis of native arteries of extremities, unspecified extremity: Secondary | ICD-10-CM | POA: Diagnosis not present

## 2014-04-15 DIAGNOSIS — Z89422 Acquired absence of other left toe(s): Secondary | ICD-10-CM | POA: Diagnosis not present

## 2014-04-15 DIAGNOSIS — I120 Hypertensive chronic kidney disease with stage 5 chronic kidney disease or end stage renal disease: Secondary | ICD-10-CM | POA: Diagnosis not present

## 2014-04-15 DIAGNOSIS — M109 Gout, unspecified: Secondary | ICD-10-CM | POA: Diagnosis not present

## 2014-04-15 DIAGNOSIS — Z4789 Encounter for other orthopedic aftercare: Secondary | ICD-10-CM | POA: Diagnosis not present

## 2014-04-15 DIAGNOSIS — N189 Chronic kidney disease, unspecified: Secondary | ICD-10-CM | POA: Diagnosis not present

## 2014-04-15 DIAGNOSIS — K219 Gastro-esophageal reflux disease without esophagitis: Secondary | ICD-10-CM | POA: Diagnosis not present

## 2014-04-15 DIAGNOSIS — E11610 Type 2 diabetes mellitus with diabetic neuropathic arthropathy: Secondary | ICD-10-CM | POA: Diagnosis not present

## 2014-04-15 DIAGNOSIS — Z794 Long term (current) use of insulin: Secondary | ICD-10-CM | POA: Diagnosis not present

## 2014-04-16 DIAGNOSIS — E11610 Type 2 diabetes mellitus with diabetic neuropathic arthropathy: Secondary | ICD-10-CM | POA: Diagnosis not present

## 2014-04-16 DIAGNOSIS — Z4889 Encounter for other specified surgical aftercare: Secondary | ICD-10-CM | POA: Diagnosis not present

## 2014-04-17 DIAGNOSIS — I120 Hypertensive chronic kidney disease with stage 5 chronic kidney disease or end stage renal disease: Secondary | ICD-10-CM | POA: Diagnosis not present

## 2014-04-17 DIAGNOSIS — N189 Chronic kidney disease, unspecified: Secondary | ICD-10-CM | POA: Diagnosis not present

## 2014-04-17 DIAGNOSIS — M109 Gout, unspecified: Secondary | ICD-10-CM | POA: Diagnosis not present

## 2014-04-17 DIAGNOSIS — Z4789 Encounter for other orthopedic aftercare: Secondary | ICD-10-CM | POA: Diagnosis not present

## 2014-04-17 DIAGNOSIS — E11610 Type 2 diabetes mellitus with diabetic neuropathic arthropathy: Secondary | ICD-10-CM | POA: Diagnosis not present

## 2014-04-17 DIAGNOSIS — I70209 Unspecified atherosclerosis of native arteries of extremities, unspecified extremity: Secondary | ICD-10-CM | POA: Diagnosis not present

## 2014-04-17 DIAGNOSIS — Z89422 Acquired absence of other left toe(s): Secondary | ICD-10-CM | POA: Diagnosis not present

## 2014-04-17 DIAGNOSIS — K219 Gastro-esophageal reflux disease without esophagitis: Secondary | ICD-10-CM | POA: Diagnosis not present

## 2014-04-17 DIAGNOSIS — Z794 Long term (current) use of insulin: Secondary | ICD-10-CM | POA: Diagnosis not present

## 2014-04-23 DIAGNOSIS — E11610 Type 2 diabetes mellitus with diabetic neuropathic arthropathy: Secondary | ICD-10-CM | POA: Diagnosis not present

## 2014-04-23 DIAGNOSIS — I120 Hypertensive chronic kidney disease with stage 5 chronic kidney disease or end stage renal disease: Secondary | ICD-10-CM | POA: Diagnosis not present

## 2014-04-23 DIAGNOSIS — N189 Chronic kidney disease, unspecified: Secondary | ICD-10-CM | POA: Diagnosis not present

## 2014-04-23 DIAGNOSIS — Z4789 Encounter for other orthopedic aftercare: Secondary | ICD-10-CM | POA: Diagnosis not present

## 2014-04-23 DIAGNOSIS — Z794 Long term (current) use of insulin: Secondary | ICD-10-CM | POA: Diagnosis not present

## 2014-04-23 DIAGNOSIS — M109 Gout, unspecified: Secondary | ICD-10-CM | POA: Diagnosis not present

## 2014-04-23 DIAGNOSIS — I70209 Unspecified atherosclerosis of native arteries of extremities, unspecified extremity: Secondary | ICD-10-CM | POA: Diagnosis not present

## 2014-04-23 DIAGNOSIS — Z89422 Acquired absence of other left toe(s): Secondary | ICD-10-CM | POA: Diagnosis not present

## 2014-04-23 DIAGNOSIS — K219 Gastro-esophageal reflux disease without esophagitis: Secondary | ICD-10-CM | POA: Diagnosis not present

## 2014-04-25 DIAGNOSIS — N189 Chronic kidney disease, unspecified: Secondary | ICD-10-CM | POA: Diagnosis not present

## 2014-04-25 DIAGNOSIS — Z89422 Acquired absence of other left toe(s): Secondary | ICD-10-CM | POA: Diagnosis not present

## 2014-04-25 DIAGNOSIS — M109 Gout, unspecified: Secondary | ICD-10-CM | POA: Diagnosis not present

## 2014-04-25 DIAGNOSIS — I70209 Unspecified atherosclerosis of native arteries of extremities, unspecified extremity: Secondary | ICD-10-CM | POA: Diagnosis not present

## 2014-04-25 DIAGNOSIS — E11610 Type 2 diabetes mellitus with diabetic neuropathic arthropathy: Secondary | ICD-10-CM | POA: Diagnosis not present

## 2014-04-25 DIAGNOSIS — K219 Gastro-esophageal reflux disease without esophagitis: Secondary | ICD-10-CM | POA: Diagnosis not present

## 2014-04-25 DIAGNOSIS — I120 Hypertensive chronic kidney disease with stage 5 chronic kidney disease or end stage renal disease: Secondary | ICD-10-CM | POA: Diagnosis not present

## 2014-04-25 DIAGNOSIS — Z4789 Encounter for other orthopedic aftercare: Secondary | ICD-10-CM | POA: Diagnosis not present

## 2014-04-25 DIAGNOSIS — Z794 Long term (current) use of insulin: Secondary | ICD-10-CM | POA: Diagnosis not present

## 2014-04-28 DIAGNOSIS — N189 Chronic kidney disease, unspecified: Secondary | ICD-10-CM | POA: Diagnosis not present

## 2014-04-28 DIAGNOSIS — K219 Gastro-esophageal reflux disease without esophagitis: Secondary | ICD-10-CM | POA: Diagnosis not present

## 2014-04-28 DIAGNOSIS — Z89422 Acquired absence of other left toe(s): Secondary | ICD-10-CM | POA: Diagnosis not present

## 2014-04-28 DIAGNOSIS — I70209 Unspecified atherosclerosis of native arteries of extremities, unspecified extremity: Secondary | ICD-10-CM | POA: Diagnosis not present

## 2014-04-28 DIAGNOSIS — I120 Hypertensive chronic kidney disease with stage 5 chronic kidney disease or end stage renal disease: Secondary | ICD-10-CM | POA: Diagnosis not present

## 2014-04-28 DIAGNOSIS — M109 Gout, unspecified: Secondary | ICD-10-CM | POA: Diagnosis not present

## 2014-04-28 DIAGNOSIS — Z794 Long term (current) use of insulin: Secondary | ICD-10-CM | POA: Diagnosis not present

## 2014-04-28 DIAGNOSIS — E11610 Type 2 diabetes mellitus with diabetic neuropathic arthropathy: Secondary | ICD-10-CM | POA: Diagnosis not present

## 2014-04-28 DIAGNOSIS — Z4789 Encounter for other orthopedic aftercare: Secondary | ICD-10-CM | POA: Diagnosis not present

## 2014-04-29 DIAGNOSIS — Z4889 Encounter for other specified surgical aftercare: Secondary | ICD-10-CM | POA: Diagnosis not present

## 2014-04-29 DIAGNOSIS — E11610 Type 2 diabetes mellitus with diabetic neuropathic arthropathy: Secondary | ICD-10-CM | POA: Diagnosis not present

## 2014-04-30 DIAGNOSIS — N189 Chronic kidney disease, unspecified: Secondary | ICD-10-CM | POA: Diagnosis not present

## 2014-04-30 DIAGNOSIS — Z794 Long term (current) use of insulin: Secondary | ICD-10-CM | POA: Diagnosis not present

## 2014-04-30 DIAGNOSIS — I70209 Unspecified atherosclerosis of native arteries of extremities, unspecified extremity: Secondary | ICD-10-CM | POA: Diagnosis not present

## 2014-04-30 DIAGNOSIS — M109 Gout, unspecified: Secondary | ICD-10-CM | POA: Diagnosis not present

## 2014-04-30 DIAGNOSIS — E11610 Type 2 diabetes mellitus with diabetic neuropathic arthropathy: Secondary | ICD-10-CM | POA: Diagnosis not present

## 2014-04-30 DIAGNOSIS — K219 Gastro-esophageal reflux disease without esophagitis: Secondary | ICD-10-CM | POA: Diagnosis not present

## 2014-04-30 DIAGNOSIS — Z89422 Acquired absence of other left toe(s): Secondary | ICD-10-CM | POA: Diagnosis not present

## 2014-04-30 DIAGNOSIS — Z4789 Encounter for other orthopedic aftercare: Secondary | ICD-10-CM | POA: Diagnosis not present

## 2014-04-30 DIAGNOSIS — I120 Hypertensive chronic kidney disease with stage 5 chronic kidney disease or end stage renal disease: Secondary | ICD-10-CM | POA: Diagnosis not present

## 2014-05-05 DIAGNOSIS — I70209 Unspecified atherosclerosis of native arteries of extremities, unspecified extremity: Secondary | ICD-10-CM | POA: Diagnosis not present

## 2014-05-05 DIAGNOSIS — N189 Chronic kidney disease, unspecified: Secondary | ICD-10-CM | POA: Diagnosis not present

## 2014-05-05 DIAGNOSIS — E11610 Type 2 diabetes mellitus with diabetic neuropathic arthropathy: Secondary | ICD-10-CM | POA: Diagnosis not present

## 2014-05-05 DIAGNOSIS — Z89422 Acquired absence of other left toe(s): Secondary | ICD-10-CM | POA: Diagnosis not present

## 2014-05-05 DIAGNOSIS — K219 Gastro-esophageal reflux disease without esophagitis: Secondary | ICD-10-CM | POA: Diagnosis not present

## 2014-05-05 DIAGNOSIS — M109 Gout, unspecified: Secondary | ICD-10-CM | POA: Diagnosis not present

## 2014-05-05 DIAGNOSIS — Z4789 Encounter for other orthopedic aftercare: Secondary | ICD-10-CM | POA: Diagnosis not present

## 2014-05-05 DIAGNOSIS — I120 Hypertensive chronic kidney disease with stage 5 chronic kidney disease or end stage renal disease: Secondary | ICD-10-CM | POA: Diagnosis not present

## 2014-05-05 DIAGNOSIS — Z794 Long term (current) use of insulin: Secondary | ICD-10-CM | POA: Diagnosis not present

## 2014-05-07 DIAGNOSIS — K219 Gastro-esophageal reflux disease without esophagitis: Secondary | ICD-10-CM | POA: Diagnosis not present

## 2014-05-07 DIAGNOSIS — Z4789 Encounter for other orthopedic aftercare: Secondary | ICD-10-CM | POA: Diagnosis not present

## 2014-05-07 DIAGNOSIS — I70209 Unspecified atherosclerosis of native arteries of extremities, unspecified extremity: Secondary | ICD-10-CM | POA: Diagnosis not present

## 2014-05-07 DIAGNOSIS — I120 Hypertensive chronic kidney disease with stage 5 chronic kidney disease or end stage renal disease: Secondary | ICD-10-CM | POA: Diagnosis not present

## 2014-05-07 DIAGNOSIS — M109 Gout, unspecified: Secondary | ICD-10-CM | POA: Diagnosis not present

## 2014-05-07 DIAGNOSIS — Z794 Long term (current) use of insulin: Secondary | ICD-10-CM | POA: Diagnosis not present

## 2014-05-07 DIAGNOSIS — Z89422 Acquired absence of other left toe(s): Secondary | ICD-10-CM | POA: Diagnosis not present

## 2014-05-07 DIAGNOSIS — E11610 Type 2 diabetes mellitus with diabetic neuropathic arthropathy: Secondary | ICD-10-CM | POA: Diagnosis not present

## 2014-05-07 DIAGNOSIS — N189 Chronic kidney disease, unspecified: Secondary | ICD-10-CM | POA: Diagnosis not present

## 2014-05-12 DIAGNOSIS — I70209 Unspecified atherosclerosis of native arteries of extremities, unspecified extremity: Secondary | ICD-10-CM | POA: Diagnosis not present

## 2014-05-12 DIAGNOSIS — Z89422 Acquired absence of other left toe(s): Secondary | ICD-10-CM | POA: Diagnosis not present

## 2014-05-12 DIAGNOSIS — E11610 Type 2 diabetes mellitus with diabetic neuropathic arthropathy: Secondary | ICD-10-CM | POA: Diagnosis not present

## 2014-05-12 DIAGNOSIS — K219 Gastro-esophageal reflux disease without esophagitis: Secondary | ICD-10-CM | POA: Diagnosis not present

## 2014-05-12 DIAGNOSIS — Z4789 Encounter for other orthopedic aftercare: Secondary | ICD-10-CM | POA: Diagnosis not present

## 2014-05-12 DIAGNOSIS — Z794 Long term (current) use of insulin: Secondary | ICD-10-CM | POA: Diagnosis not present

## 2014-05-12 DIAGNOSIS — N189 Chronic kidney disease, unspecified: Secondary | ICD-10-CM | POA: Diagnosis not present

## 2014-05-12 DIAGNOSIS — I120 Hypertensive chronic kidney disease with stage 5 chronic kidney disease or end stage renal disease: Secondary | ICD-10-CM | POA: Diagnosis not present

## 2014-05-12 DIAGNOSIS — M109 Gout, unspecified: Secondary | ICD-10-CM | POA: Diagnosis not present

## 2014-05-14 DIAGNOSIS — Z794 Long term (current) use of insulin: Secondary | ICD-10-CM | POA: Diagnosis not present

## 2014-05-14 DIAGNOSIS — K219 Gastro-esophageal reflux disease without esophagitis: Secondary | ICD-10-CM | POA: Diagnosis not present

## 2014-05-14 DIAGNOSIS — M109 Gout, unspecified: Secondary | ICD-10-CM | POA: Diagnosis not present

## 2014-05-14 DIAGNOSIS — Z4789 Encounter for other orthopedic aftercare: Secondary | ICD-10-CM | POA: Diagnosis not present

## 2014-05-14 DIAGNOSIS — N189 Chronic kidney disease, unspecified: Secondary | ICD-10-CM | POA: Diagnosis not present

## 2014-05-14 DIAGNOSIS — I70209 Unspecified atherosclerosis of native arteries of extremities, unspecified extremity: Secondary | ICD-10-CM | POA: Diagnosis not present

## 2014-05-14 DIAGNOSIS — Z89422 Acquired absence of other left toe(s): Secondary | ICD-10-CM | POA: Diagnosis not present

## 2014-05-14 DIAGNOSIS — E11610 Type 2 diabetes mellitus with diabetic neuropathic arthropathy: Secondary | ICD-10-CM | POA: Diagnosis not present

## 2014-05-14 DIAGNOSIS — I120 Hypertensive chronic kidney disease with stage 5 chronic kidney disease or end stage renal disease: Secondary | ICD-10-CM | POA: Diagnosis not present

## 2014-05-16 DIAGNOSIS — Z4789 Encounter for other orthopedic aftercare: Secondary | ICD-10-CM | POA: Diagnosis not present

## 2014-05-16 DIAGNOSIS — E11610 Type 2 diabetes mellitus with diabetic neuropathic arthropathy: Secondary | ICD-10-CM | POA: Diagnosis not present

## 2014-05-16 DIAGNOSIS — I120 Hypertensive chronic kidney disease with stage 5 chronic kidney disease or end stage renal disease: Secondary | ICD-10-CM | POA: Diagnosis not present

## 2014-05-16 DIAGNOSIS — I70209 Unspecified atherosclerosis of native arteries of extremities, unspecified extremity: Secondary | ICD-10-CM | POA: Diagnosis not present

## 2014-05-16 DIAGNOSIS — N189 Chronic kidney disease, unspecified: Secondary | ICD-10-CM | POA: Diagnosis not present

## 2014-05-16 DIAGNOSIS — M109 Gout, unspecified: Secondary | ICD-10-CM | POA: Diagnosis not present

## 2014-05-16 DIAGNOSIS — K219 Gastro-esophageal reflux disease without esophagitis: Secondary | ICD-10-CM | POA: Diagnosis not present

## 2014-05-16 DIAGNOSIS — Z794 Long term (current) use of insulin: Secondary | ICD-10-CM | POA: Diagnosis not present

## 2014-05-16 DIAGNOSIS — Z89422 Acquired absence of other left toe(s): Secondary | ICD-10-CM | POA: Diagnosis not present

## 2014-05-19 DIAGNOSIS — I70209 Unspecified atherosclerosis of native arteries of extremities, unspecified extremity: Secondary | ICD-10-CM | POA: Diagnosis not present

## 2014-05-19 DIAGNOSIS — M109 Gout, unspecified: Secondary | ICD-10-CM | POA: Diagnosis not present

## 2014-05-19 DIAGNOSIS — Z794 Long term (current) use of insulin: Secondary | ICD-10-CM | POA: Diagnosis not present

## 2014-05-19 DIAGNOSIS — Z89422 Acquired absence of other left toe(s): Secondary | ICD-10-CM | POA: Diagnosis not present

## 2014-05-19 DIAGNOSIS — K219 Gastro-esophageal reflux disease without esophagitis: Secondary | ICD-10-CM | POA: Diagnosis not present

## 2014-05-19 DIAGNOSIS — N189 Chronic kidney disease, unspecified: Secondary | ICD-10-CM | POA: Diagnosis not present

## 2014-05-19 DIAGNOSIS — E11610 Type 2 diabetes mellitus with diabetic neuropathic arthropathy: Secondary | ICD-10-CM | POA: Diagnosis not present

## 2014-05-19 DIAGNOSIS — I120 Hypertensive chronic kidney disease with stage 5 chronic kidney disease or end stage renal disease: Secondary | ICD-10-CM | POA: Diagnosis not present

## 2014-05-19 DIAGNOSIS — Z4789 Encounter for other orthopedic aftercare: Secondary | ICD-10-CM | POA: Diagnosis not present

## 2014-05-21 DIAGNOSIS — M109 Gout, unspecified: Secondary | ICD-10-CM | POA: Diagnosis not present

## 2014-05-21 DIAGNOSIS — Z794 Long term (current) use of insulin: Secondary | ICD-10-CM | POA: Diagnosis not present

## 2014-05-21 DIAGNOSIS — I70209 Unspecified atherosclerosis of native arteries of extremities, unspecified extremity: Secondary | ICD-10-CM | POA: Diagnosis not present

## 2014-05-21 DIAGNOSIS — Z4789 Encounter for other orthopedic aftercare: Secondary | ICD-10-CM | POA: Diagnosis not present

## 2014-05-21 DIAGNOSIS — N189 Chronic kidney disease, unspecified: Secondary | ICD-10-CM | POA: Diagnosis not present

## 2014-05-21 DIAGNOSIS — Z89422 Acquired absence of other left toe(s): Secondary | ICD-10-CM | POA: Diagnosis not present

## 2014-05-21 DIAGNOSIS — K219 Gastro-esophageal reflux disease without esophagitis: Secondary | ICD-10-CM | POA: Diagnosis not present

## 2014-05-21 DIAGNOSIS — E11610 Type 2 diabetes mellitus with diabetic neuropathic arthropathy: Secondary | ICD-10-CM | POA: Diagnosis not present

## 2014-05-21 DIAGNOSIS — I120 Hypertensive chronic kidney disease with stage 5 chronic kidney disease or end stage renal disease: Secondary | ICD-10-CM | POA: Diagnosis not present

## 2014-05-23 DIAGNOSIS — N189 Chronic kidney disease, unspecified: Secondary | ICD-10-CM | POA: Diagnosis not present

## 2014-05-23 DIAGNOSIS — Z794 Long term (current) use of insulin: Secondary | ICD-10-CM | POA: Diagnosis not present

## 2014-05-23 DIAGNOSIS — M109 Gout, unspecified: Secondary | ICD-10-CM | POA: Diagnosis not present

## 2014-05-23 DIAGNOSIS — I120 Hypertensive chronic kidney disease with stage 5 chronic kidney disease or end stage renal disease: Secondary | ICD-10-CM | POA: Diagnosis not present

## 2014-05-23 DIAGNOSIS — K219 Gastro-esophageal reflux disease without esophagitis: Secondary | ICD-10-CM | POA: Diagnosis not present

## 2014-05-23 DIAGNOSIS — Z89422 Acquired absence of other left toe(s): Secondary | ICD-10-CM | POA: Diagnosis not present

## 2014-05-23 DIAGNOSIS — E11610 Type 2 diabetes mellitus with diabetic neuropathic arthropathy: Secondary | ICD-10-CM | POA: Diagnosis not present

## 2014-05-23 DIAGNOSIS — Z4789 Encounter for other orthopedic aftercare: Secondary | ICD-10-CM | POA: Diagnosis not present

## 2014-05-23 DIAGNOSIS — I70209 Unspecified atherosclerosis of native arteries of extremities, unspecified extremity: Secondary | ICD-10-CM | POA: Diagnosis not present

## 2014-05-25 DIAGNOSIS — N189 Chronic kidney disease, unspecified: Secondary | ICD-10-CM | POA: Diagnosis not present

## 2014-05-25 DIAGNOSIS — Z89422 Acquired absence of other left toe(s): Secondary | ICD-10-CM | POA: Diagnosis not present

## 2014-05-25 DIAGNOSIS — I120 Hypertensive chronic kidney disease with stage 5 chronic kidney disease or end stage renal disease: Secondary | ICD-10-CM | POA: Diagnosis not present

## 2014-05-25 DIAGNOSIS — I70209 Unspecified atherosclerosis of native arteries of extremities, unspecified extremity: Secondary | ICD-10-CM | POA: Diagnosis not present

## 2014-05-25 DIAGNOSIS — M109 Gout, unspecified: Secondary | ICD-10-CM | POA: Diagnosis not present

## 2014-05-25 DIAGNOSIS — K219 Gastro-esophageal reflux disease without esophagitis: Secondary | ICD-10-CM | POA: Diagnosis not present

## 2014-05-25 DIAGNOSIS — E11610 Type 2 diabetes mellitus with diabetic neuropathic arthropathy: Secondary | ICD-10-CM | POA: Diagnosis not present

## 2014-05-25 DIAGNOSIS — Z794 Long term (current) use of insulin: Secondary | ICD-10-CM | POA: Diagnosis not present

## 2014-05-25 DIAGNOSIS — Z4789 Encounter for other orthopedic aftercare: Secondary | ICD-10-CM | POA: Diagnosis not present

## 2014-05-28 DIAGNOSIS — N189 Chronic kidney disease, unspecified: Secondary | ICD-10-CM | POA: Diagnosis not present

## 2014-05-28 DIAGNOSIS — Z794 Long term (current) use of insulin: Secondary | ICD-10-CM | POA: Diagnosis not present

## 2014-05-28 DIAGNOSIS — K219 Gastro-esophageal reflux disease without esophagitis: Secondary | ICD-10-CM | POA: Diagnosis not present

## 2014-05-28 DIAGNOSIS — M109 Gout, unspecified: Secondary | ICD-10-CM | POA: Diagnosis not present

## 2014-05-28 DIAGNOSIS — E11610 Type 2 diabetes mellitus with diabetic neuropathic arthropathy: Secondary | ICD-10-CM | POA: Diagnosis not present

## 2014-05-28 DIAGNOSIS — Z89422 Acquired absence of other left toe(s): Secondary | ICD-10-CM | POA: Diagnosis not present

## 2014-05-28 DIAGNOSIS — I70209 Unspecified atherosclerosis of native arteries of extremities, unspecified extremity: Secondary | ICD-10-CM | POA: Diagnosis not present

## 2014-05-28 DIAGNOSIS — Z4789 Encounter for other orthopedic aftercare: Secondary | ICD-10-CM | POA: Diagnosis not present

## 2014-05-28 DIAGNOSIS — I120 Hypertensive chronic kidney disease with stage 5 chronic kidney disease or end stage renal disease: Secondary | ICD-10-CM | POA: Diagnosis not present

## 2014-05-30 DIAGNOSIS — I70209 Unspecified atherosclerosis of native arteries of extremities, unspecified extremity: Secondary | ICD-10-CM | POA: Diagnosis not present

## 2014-05-30 DIAGNOSIS — I120 Hypertensive chronic kidney disease with stage 5 chronic kidney disease or end stage renal disease: Secondary | ICD-10-CM | POA: Diagnosis not present

## 2014-05-30 DIAGNOSIS — M109 Gout, unspecified: Secondary | ICD-10-CM | POA: Diagnosis not present

## 2014-05-30 DIAGNOSIS — Z89422 Acquired absence of other left toe(s): Secondary | ICD-10-CM | POA: Diagnosis not present

## 2014-05-30 DIAGNOSIS — N189 Chronic kidney disease, unspecified: Secondary | ICD-10-CM | POA: Diagnosis not present

## 2014-05-30 DIAGNOSIS — K219 Gastro-esophageal reflux disease without esophagitis: Secondary | ICD-10-CM | POA: Diagnosis not present

## 2014-05-30 DIAGNOSIS — Z794 Long term (current) use of insulin: Secondary | ICD-10-CM | POA: Diagnosis not present

## 2014-05-30 DIAGNOSIS — E11610 Type 2 diabetes mellitus with diabetic neuropathic arthropathy: Secondary | ICD-10-CM | POA: Diagnosis not present

## 2014-05-30 DIAGNOSIS — Z4789 Encounter for other orthopedic aftercare: Secondary | ICD-10-CM | POA: Diagnosis not present

## 2014-06-02 DIAGNOSIS — E11610 Type 2 diabetes mellitus with diabetic neuropathic arthropathy: Secondary | ICD-10-CM | POA: Diagnosis not present

## 2014-06-02 DIAGNOSIS — Z4889 Encounter for other specified surgical aftercare: Secondary | ICD-10-CM | POA: Diagnosis not present

## 2014-06-03 DIAGNOSIS — Z4789 Encounter for other orthopedic aftercare: Secondary | ICD-10-CM | POA: Diagnosis not present

## 2014-06-03 DIAGNOSIS — K219 Gastro-esophageal reflux disease without esophagitis: Secondary | ICD-10-CM | POA: Diagnosis not present

## 2014-06-03 DIAGNOSIS — Z794 Long term (current) use of insulin: Secondary | ICD-10-CM | POA: Diagnosis not present

## 2014-06-03 DIAGNOSIS — Z89422 Acquired absence of other left toe(s): Secondary | ICD-10-CM | POA: Diagnosis not present

## 2014-06-03 DIAGNOSIS — E11610 Type 2 diabetes mellitus with diabetic neuropathic arthropathy: Secondary | ICD-10-CM | POA: Diagnosis not present

## 2014-06-03 DIAGNOSIS — I70209 Unspecified atherosclerosis of native arteries of extremities, unspecified extremity: Secondary | ICD-10-CM | POA: Diagnosis not present

## 2014-06-03 DIAGNOSIS — M109 Gout, unspecified: Secondary | ICD-10-CM | POA: Diagnosis not present

## 2014-06-03 DIAGNOSIS — I120 Hypertensive chronic kidney disease with stage 5 chronic kidney disease or end stage renal disease: Secondary | ICD-10-CM | POA: Diagnosis not present

## 2014-06-03 DIAGNOSIS — N189 Chronic kidney disease, unspecified: Secondary | ICD-10-CM | POA: Diagnosis not present

## 2014-06-05 DIAGNOSIS — E1129 Type 2 diabetes mellitus with other diabetic kidney complication: Secondary | ICD-10-CM | POA: Diagnosis not present

## 2014-06-05 DIAGNOSIS — K219 Gastro-esophageal reflux disease without esophagitis: Secondary | ICD-10-CM | POA: Diagnosis not present

## 2014-06-05 DIAGNOSIS — Z0000 Encounter for general adult medical examination without abnormal findings: Secondary | ICD-10-CM | POA: Diagnosis not present

## 2014-06-05 DIAGNOSIS — Z1389 Encounter for screening for other disorder: Secondary | ICD-10-CM | POA: Diagnosis not present

## 2014-06-06 DIAGNOSIS — I120 Hypertensive chronic kidney disease with stage 5 chronic kidney disease or end stage renal disease: Secondary | ICD-10-CM | POA: Diagnosis not present

## 2014-06-06 DIAGNOSIS — M109 Gout, unspecified: Secondary | ICD-10-CM | POA: Diagnosis not present

## 2014-06-06 DIAGNOSIS — Z794 Long term (current) use of insulin: Secondary | ICD-10-CM | POA: Diagnosis not present

## 2014-06-06 DIAGNOSIS — E11610 Type 2 diabetes mellitus with diabetic neuropathic arthropathy: Secondary | ICD-10-CM | POA: Diagnosis not present

## 2014-06-06 DIAGNOSIS — Z4789 Encounter for other orthopedic aftercare: Secondary | ICD-10-CM | POA: Diagnosis not present

## 2014-06-06 DIAGNOSIS — Z89422 Acquired absence of other left toe(s): Secondary | ICD-10-CM | POA: Diagnosis not present

## 2014-06-06 DIAGNOSIS — K219 Gastro-esophageal reflux disease without esophagitis: Secondary | ICD-10-CM | POA: Diagnosis not present

## 2014-06-06 DIAGNOSIS — N189 Chronic kidney disease, unspecified: Secondary | ICD-10-CM | POA: Diagnosis not present

## 2014-06-06 DIAGNOSIS — I70209 Unspecified atherosclerosis of native arteries of extremities, unspecified extremity: Secondary | ICD-10-CM | POA: Diagnosis not present

## 2014-06-09 DIAGNOSIS — Z4789 Encounter for other orthopedic aftercare: Secondary | ICD-10-CM | POA: Diagnosis not present

## 2014-06-09 DIAGNOSIS — Z89422 Acquired absence of other left toe(s): Secondary | ICD-10-CM | POA: Diagnosis not present

## 2014-06-09 DIAGNOSIS — I70209 Unspecified atherosclerosis of native arteries of extremities, unspecified extremity: Secondary | ICD-10-CM | POA: Diagnosis not present

## 2014-06-09 DIAGNOSIS — M109 Gout, unspecified: Secondary | ICD-10-CM | POA: Diagnosis not present

## 2014-06-09 DIAGNOSIS — Z794 Long term (current) use of insulin: Secondary | ICD-10-CM | POA: Diagnosis not present

## 2014-06-09 DIAGNOSIS — I120 Hypertensive chronic kidney disease with stage 5 chronic kidney disease or end stage renal disease: Secondary | ICD-10-CM | POA: Diagnosis not present

## 2014-06-09 DIAGNOSIS — K219 Gastro-esophageal reflux disease without esophagitis: Secondary | ICD-10-CM | POA: Diagnosis not present

## 2014-06-09 DIAGNOSIS — N189 Chronic kidney disease, unspecified: Secondary | ICD-10-CM | POA: Diagnosis not present

## 2014-06-09 DIAGNOSIS — E11610 Type 2 diabetes mellitus with diabetic neuropathic arthropathy: Secondary | ICD-10-CM | POA: Diagnosis not present

## 2014-06-11 DIAGNOSIS — Z4789 Encounter for other orthopedic aftercare: Secondary | ICD-10-CM | POA: Diagnosis not present

## 2014-06-11 DIAGNOSIS — I120 Hypertensive chronic kidney disease with stage 5 chronic kidney disease or end stage renal disease: Secondary | ICD-10-CM | POA: Diagnosis not present

## 2014-06-11 DIAGNOSIS — N189 Chronic kidney disease, unspecified: Secondary | ICD-10-CM | POA: Diagnosis not present

## 2014-06-11 DIAGNOSIS — E11610 Type 2 diabetes mellitus with diabetic neuropathic arthropathy: Secondary | ICD-10-CM | POA: Diagnosis not present

## 2014-06-11 DIAGNOSIS — Z794 Long term (current) use of insulin: Secondary | ICD-10-CM | POA: Diagnosis not present

## 2014-06-11 DIAGNOSIS — K219 Gastro-esophageal reflux disease without esophagitis: Secondary | ICD-10-CM | POA: Diagnosis not present

## 2014-06-11 DIAGNOSIS — I70209 Unspecified atherosclerosis of native arteries of extremities, unspecified extremity: Secondary | ICD-10-CM | POA: Diagnosis not present

## 2014-06-11 DIAGNOSIS — M109 Gout, unspecified: Secondary | ICD-10-CM | POA: Diagnosis not present

## 2014-06-11 DIAGNOSIS — Z89422 Acquired absence of other left toe(s): Secondary | ICD-10-CM | POA: Diagnosis not present

## 2014-06-12 DIAGNOSIS — E11610 Type 2 diabetes mellitus with diabetic neuropathic arthropathy: Secondary | ICD-10-CM | POA: Diagnosis not present

## 2014-06-12 DIAGNOSIS — Z4889 Encounter for other specified surgical aftercare: Secondary | ICD-10-CM | POA: Diagnosis not present

## 2014-06-17 DIAGNOSIS — N189 Chronic kidney disease, unspecified: Secondary | ICD-10-CM | POA: Diagnosis not present

## 2014-06-17 DIAGNOSIS — Z794 Long term (current) use of insulin: Secondary | ICD-10-CM | POA: Diagnosis not present

## 2014-06-17 DIAGNOSIS — K219 Gastro-esophageal reflux disease without esophagitis: Secondary | ICD-10-CM | POA: Diagnosis not present

## 2014-06-17 DIAGNOSIS — Z4789 Encounter for other orthopedic aftercare: Secondary | ICD-10-CM | POA: Diagnosis not present

## 2014-06-17 DIAGNOSIS — M109 Gout, unspecified: Secondary | ICD-10-CM | POA: Diagnosis not present

## 2014-06-17 DIAGNOSIS — I120 Hypertensive chronic kidney disease with stage 5 chronic kidney disease or end stage renal disease: Secondary | ICD-10-CM | POA: Diagnosis not present

## 2014-06-17 DIAGNOSIS — I70209 Unspecified atherosclerosis of native arteries of extremities, unspecified extremity: Secondary | ICD-10-CM | POA: Diagnosis not present

## 2014-06-17 DIAGNOSIS — E11610 Type 2 diabetes mellitus with diabetic neuropathic arthropathy: Secondary | ICD-10-CM | POA: Diagnosis not present

## 2014-06-17 DIAGNOSIS — Z89422 Acquired absence of other left toe(s): Secondary | ICD-10-CM | POA: Diagnosis not present

## 2014-06-19 DIAGNOSIS — I70209 Unspecified atherosclerosis of native arteries of extremities, unspecified extremity: Secondary | ICD-10-CM | POA: Diagnosis not present

## 2014-06-19 DIAGNOSIS — Z89422 Acquired absence of other left toe(s): Secondary | ICD-10-CM | POA: Diagnosis not present

## 2014-06-19 DIAGNOSIS — K219 Gastro-esophageal reflux disease without esophagitis: Secondary | ICD-10-CM | POA: Diagnosis not present

## 2014-06-19 DIAGNOSIS — Z794 Long term (current) use of insulin: Secondary | ICD-10-CM | POA: Diagnosis not present

## 2014-06-19 DIAGNOSIS — I120 Hypertensive chronic kidney disease with stage 5 chronic kidney disease or end stage renal disease: Secondary | ICD-10-CM | POA: Diagnosis not present

## 2014-06-19 DIAGNOSIS — E11610 Type 2 diabetes mellitus with diabetic neuropathic arthropathy: Secondary | ICD-10-CM | POA: Diagnosis not present

## 2014-06-19 DIAGNOSIS — N189 Chronic kidney disease, unspecified: Secondary | ICD-10-CM | POA: Diagnosis not present

## 2014-06-19 DIAGNOSIS — M109 Gout, unspecified: Secondary | ICD-10-CM | POA: Diagnosis not present

## 2014-06-19 DIAGNOSIS — Z4789 Encounter for other orthopedic aftercare: Secondary | ICD-10-CM | POA: Diagnosis not present

## 2014-07-03 DIAGNOSIS — J302 Other seasonal allergic rhinitis: Secondary | ICD-10-CM | POA: Diagnosis not present

## 2014-07-03 DIAGNOSIS — Z1389 Encounter for screening for other disorder: Secondary | ICD-10-CM | POA: Diagnosis not present

## 2014-07-03 DIAGNOSIS — M109 Gout, unspecified: Secondary | ICD-10-CM | POA: Diagnosis not present

## 2014-07-03 DIAGNOSIS — K219 Gastro-esophageal reflux disease without esophagitis: Secondary | ICD-10-CM | POA: Diagnosis not present

## 2014-07-14 DIAGNOSIS — E11610 Type 2 diabetes mellitus with diabetic neuropathic arthropathy: Secondary | ICD-10-CM | POA: Diagnosis not present

## 2014-07-17 DIAGNOSIS — E11610 Type 2 diabetes mellitus with diabetic neuropathic arthropathy: Secondary | ICD-10-CM | POA: Diagnosis not present

## 2014-07-17 DIAGNOSIS — Z4889 Encounter for other specified surgical aftercare: Secondary | ICD-10-CM | POA: Diagnosis not present

## 2014-07-31 DIAGNOSIS — H7392 Unspecified disorder of tympanic membrane, left ear: Secondary | ICD-10-CM | POA: Diagnosis not present

## 2014-07-31 DIAGNOSIS — E780 Pure hypercholesterolemia: Secondary | ICD-10-CM | POA: Diagnosis not present

## 2014-07-31 DIAGNOSIS — H9209 Otalgia, unspecified ear: Secondary | ICD-10-CM | POA: Diagnosis not present

## 2014-07-31 DIAGNOSIS — Z794 Long term (current) use of insulin: Secondary | ICD-10-CM | POA: Diagnosis not present

## 2014-07-31 DIAGNOSIS — E119 Type 2 diabetes mellitus without complications: Secondary | ICD-10-CM | POA: Diagnosis not present

## 2014-07-31 DIAGNOSIS — H73892 Other specified disorders of tympanic membrane, left ear: Secondary | ICD-10-CM | POA: Diagnosis not present

## 2014-07-31 DIAGNOSIS — I10 Essential (primary) hypertension: Secondary | ICD-10-CM | POA: Diagnosis not present

## 2014-08-18 DIAGNOSIS — E11610 Type 2 diabetes mellitus with diabetic neuropathic arthropathy: Secondary | ICD-10-CM | POA: Diagnosis not present

## 2014-08-18 DIAGNOSIS — Z89432 Acquired absence of left foot: Secondary | ICD-10-CM | POA: Diagnosis not present

## 2014-09-30 DIAGNOSIS — E119 Type 2 diabetes mellitus without complications: Secondary | ICD-10-CM | POA: Diagnosis not present

## 2014-10-07 DIAGNOSIS — N189 Chronic kidney disease, unspecified: Secondary | ICD-10-CM | POA: Diagnosis not present

## 2014-10-17 DIAGNOSIS — E11610 Type 2 diabetes mellitus with diabetic neuropathic arthropathy: Secondary | ICD-10-CM | POA: Diagnosis not present

## 2014-10-17 DIAGNOSIS — M14671 Charcot's joint, right ankle and foot: Secondary | ICD-10-CM | POA: Diagnosis not present

## 2014-10-17 DIAGNOSIS — Z89432 Acquired absence of left foot: Secondary | ICD-10-CM | POA: Diagnosis not present

## 2014-10-17 DIAGNOSIS — Z89411 Acquired absence of right great toe: Secondary | ICD-10-CM | POA: Diagnosis not present

## 2014-11-04 DIAGNOSIS — E1165 Type 2 diabetes mellitus with hyperglycemia: Secondary | ICD-10-CM | POA: Diagnosis not present

## 2014-11-13 DIAGNOSIS — Z89432 Acquired absence of left foot: Secondary | ICD-10-CM | POA: Diagnosis not present

## 2014-11-13 DIAGNOSIS — E11610 Type 2 diabetes mellitus with diabetic neuropathic arthropathy: Secondary | ICD-10-CM | POA: Diagnosis not present

## 2014-11-13 DIAGNOSIS — L603 Nail dystrophy: Secondary | ICD-10-CM | POA: Diagnosis not present

## 2014-11-25 DIAGNOSIS — E1122 Type 2 diabetes mellitus with diabetic chronic kidney disease: Secondary | ICD-10-CM | POA: Diagnosis not present

## 2014-11-25 DIAGNOSIS — N2581 Secondary hyperparathyroidism of renal origin: Secondary | ICD-10-CM | POA: Diagnosis not present

## 2014-11-25 DIAGNOSIS — R7989 Other specified abnormal findings of blood chemistry: Secondary | ICD-10-CM | POA: Diagnosis not present

## 2014-11-25 DIAGNOSIS — N183 Chronic kidney disease, stage 3 (moderate): Secondary | ICD-10-CM | POA: Diagnosis not present

## 2014-12-05 DIAGNOSIS — N183 Chronic kidney disease, stage 3 (moderate): Secondary | ICD-10-CM | POA: Diagnosis not present

## 2014-12-05 DIAGNOSIS — N2581 Secondary hyperparathyroidism of renal origin: Secondary | ICD-10-CM | POA: Diagnosis not present

## 2014-12-05 DIAGNOSIS — E1122 Type 2 diabetes mellitus with diabetic chronic kidney disease: Secondary | ICD-10-CM | POA: Diagnosis not present

## 2014-12-05 DIAGNOSIS — R7989 Other specified abnormal findings of blood chemistry: Secondary | ICD-10-CM | POA: Diagnosis not present

## 2014-12-05 DIAGNOSIS — N189 Chronic kidney disease, unspecified: Secondary | ICD-10-CM | POA: Diagnosis not present

## 2014-12-24 DIAGNOSIS — H43813 Vitreous degeneration, bilateral: Secondary | ICD-10-CM | POA: Diagnosis not present

## 2014-12-24 DIAGNOSIS — E11339 Type 2 diabetes mellitus with moderate nonproliferative diabetic retinopathy without macular edema: Secondary | ICD-10-CM | POA: Diagnosis not present

## 2015-01-27 DIAGNOSIS — E119 Type 2 diabetes mellitus without complications: Secondary | ICD-10-CM | POA: Diagnosis not present

## 2015-02-03 DIAGNOSIS — N189 Chronic kidney disease, unspecified: Secondary | ICD-10-CM | POA: Diagnosis not present

## 2015-02-03 DIAGNOSIS — Z23 Encounter for immunization: Secondary | ICD-10-CM | POA: Diagnosis not present

## 2015-02-03 DIAGNOSIS — K219 Gastro-esophageal reflux disease without esophagitis: Secondary | ICD-10-CM | POA: Diagnosis not present

## 2015-02-03 DIAGNOSIS — E1165 Type 2 diabetes mellitus with hyperglycemia: Secondary | ICD-10-CM | POA: Diagnosis not present

## 2015-02-12 DIAGNOSIS — L603 Nail dystrophy: Secondary | ICD-10-CM | POA: Diagnosis not present

## 2015-02-12 DIAGNOSIS — M21372 Foot drop, left foot: Secondary | ICD-10-CM | POA: Diagnosis not present

## 2015-02-12 DIAGNOSIS — E11610 Type 2 diabetes mellitus with diabetic neuropathic arthropathy: Secondary | ICD-10-CM | POA: Diagnosis not present

## 2015-02-12 DIAGNOSIS — Z89432 Acquired absence of left foot: Secondary | ICD-10-CM | POA: Diagnosis not present

## 2015-02-12 DIAGNOSIS — M25372 Other instability, left ankle: Secondary | ICD-10-CM | POA: Diagnosis not present

## 2015-03-25 DIAGNOSIS — E109 Type 1 diabetes mellitus without complications: Secondary | ICD-10-CM | POA: Diagnosis not present

## 2015-03-25 DIAGNOSIS — H52223 Regular astigmatism, bilateral: Secondary | ICD-10-CM | POA: Diagnosis not present

## 2015-03-31 DIAGNOSIS — E1122 Type 2 diabetes mellitus with diabetic chronic kidney disease: Secondary | ICD-10-CM | POA: Diagnosis not present

## 2015-03-31 DIAGNOSIS — R7989 Other specified abnormal findings of blood chemistry: Secondary | ICD-10-CM | POA: Diagnosis not present

## 2015-03-31 DIAGNOSIS — N2581 Secondary hyperparathyroidism of renal origin: Secondary | ICD-10-CM | POA: Diagnosis not present

## 2015-03-31 DIAGNOSIS — N183 Chronic kidney disease, stage 3 (moderate): Secondary | ICD-10-CM | POA: Diagnosis not present

## 2015-04-01 ENCOUNTER — Other Ambulatory Visit: Payer: Self-pay | Admitting: Nephrology

## 2015-04-01 DIAGNOSIS — N183 Chronic kidney disease, stage 3 unspecified: Secondary | ICD-10-CM

## 2015-04-01 DIAGNOSIS — R7989 Other specified abnormal findings of blood chemistry: Secondary | ICD-10-CM

## 2015-04-14 DIAGNOSIS — N183 Chronic kidney disease, stage 3 (moderate): Secondary | ICD-10-CM | POA: Diagnosis not present

## 2015-04-14 DIAGNOSIS — R7989 Other specified abnormal findings of blood chemistry: Secondary | ICD-10-CM | POA: Diagnosis not present

## 2015-04-21 ENCOUNTER — Other Ambulatory Visit: Payer: Self-pay

## 2015-04-29 DIAGNOSIS — E1165 Type 2 diabetes mellitus with hyperglycemia: Secondary | ICD-10-CM | POA: Diagnosis not present

## 2015-04-30 DIAGNOSIS — E875 Hyperkalemia: Secondary | ICD-10-CM | POA: Diagnosis not present

## 2015-05-06 DIAGNOSIS — N189 Chronic kidney disease, unspecified: Secondary | ICD-10-CM | POA: Diagnosis not present

## 2015-05-06 DIAGNOSIS — E1165 Type 2 diabetes mellitus with hyperglycemia: Secondary | ICD-10-CM | POA: Diagnosis not present

## 2015-05-25 DIAGNOSIS — M21371 Foot drop, right foot: Secondary | ICD-10-CM | POA: Diagnosis not present

## 2015-05-25 DIAGNOSIS — Z89432 Acquired absence of left foot: Secondary | ICD-10-CM | POA: Diagnosis not present

## 2015-05-25 DIAGNOSIS — M25372 Other instability, left ankle: Secondary | ICD-10-CM | POA: Diagnosis not present

## 2015-05-25 DIAGNOSIS — M21372 Foot drop, left foot: Secondary | ICD-10-CM | POA: Diagnosis not present

## 2015-05-28 DIAGNOSIS — M25372 Other instability, left ankle: Secondary | ICD-10-CM | POA: Diagnosis not present

## 2015-05-28 DIAGNOSIS — E11610 Type 2 diabetes mellitus with diabetic neuropathic arthropathy: Secondary | ICD-10-CM | POA: Diagnosis not present

## 2015-06-05 DIAGNOSIS — R109 Unspecified abdominal pain: Secondary | ICD-10-CM | POA: Diagnosis not present

## 2015-06-05 DIAGNOSIS — E1165 Type 2 diabetes mellitus with hyperglycemia: Secondary | ICD-10-CM | POA: Diagnosis not present

## 2015-06-05 DIAGNOSIS — N189 Chronic kidney disease, unspecified: Secondary | ICD-10-CM | POA: Diagnosis not present

## 2015-06-05 DIAGNOSIS — M109 Gout, unspecified: Secondary | ICD-10-CM | POA: Diagnosis not present

## 2015-06-05 DIAGNOSIS — J302 Other seasonal allergic rhinitis: Secondary | ICD-10-CM | POA: Diagnosis not present

## 2015-07-09 DIAGNOSIS — M21372 Foot drop, left foot: Secondary | ICD-10-CM | POA: Diagnosis not present

## 2015-07-09 DIAGNOSIS — Z89432 Acquired absence of left foot: Secondary | ICD-10-CM | POA: Diagnosis not present

## 2015-07-09 DIAGNOSIS — E11610 Type 2 diabetes mellitus with diabetic neuropathic arthropathy: Secondary | ICD-10-CM | POA: Diagnosis not present

## 2015-07-09 DIAGNOSIS — L603 Nail dystrophy: Secondary | ICD-10-CM | POA: Diagnosis not present

## 2015-07-09 DIAGNOSIS — M25372 Other instability, left ankle: Secondary | ICD-10-CM | POA: Diagnosis not present

## 2015-07-23 DIAGNOSIS — E1165 Type 2 diabetes mellitus with hyperglycemia: Secondary | ICD-10-CM | POA: Diagnosis not present

## 2015-07-31 DIAGNOSIS — Z0000 Encounter for general adult medical examination without abnormal findings: Secondary | ICD-10-CM | POA: Diagnosis not present

## 2015-07-31 DIAGNOSIS — E1165 Type 2 diabetes mellitus with hyperglycemia: Secondary | ICD-10-CM | POA: Diagnosis not present

## 2015-07-31 DIAGNOSIS — Z1389 Encounter for screening for other disorder: Secondary | ICD-10-CM | POA: Diagnosis not present

## 2015-07-31 DIAGNOSIS — N189 Chronic kidney disease, unspecified: Secondary | ICD-10-CM | POA: Diagnosis not present

## 2015-08-31 DIAGNOSIS — N189 Chronic kidney disease, unspecified: Secondary | ICD-10-CM | POA: Diagnosis not present

## 2015-08-31 DIAGNOSIS — E1165 Type 2 diabetes mellitus with hyperglycemia: Secondary | ICD-10-CM | POA: Diagnosis not present

## 2015-10-15 DIAGNOSIS — Z89431 Acquired absence of right foot: Secondary | ICD-10-CM | POA: Diagnosis not present

## 2015-10-15 DIAGNOSIS — E11610 Type 2 diabetes mellitus with diabetic neuropathic arthropathy: Secondary | ICD-10-CM | POA: Diagnosis not present

## 2015-10-15 DIAGNOSIS — E11628 Type 2 diabetes mellitus with other skin complications: Secondary | ICD-10-CM | POA: Diagnosis not present

## 2015-10-15 DIAGNOSIS — Z89432 Acquired absence of left foot: Secondary | ICD-10-CM | POA: Diagnosis not present

## 2015-10-15 DIAGNOSIS — L84 Corns and callosities: Secondary | ICD-10-CM | POA: Diagnosis not present

## 2015-11-01 DIAGNOSIS — I249 Acute ischemic heart disease, unspecified: Secondary | ICD-10-CM | POA: Diagnosis not present

## 2015-11-01 DIAGNOSIS — Z79899 Other long term (current) drug therapy: Secondary | ICD-10-CM | POA: Diagnosis not present

## 2015-11-01 DIAGNOSIS — N183 Chronic kidney disease, stage 3 (moderate): Secondary | ICD-10-CM | POA: Diagnosis not present

## 2015-11-01 DIAGNOSIS — E669 Obesity, unspecified: Secondary | ICD-10-CM | POA: Diagnosis not present

## 2015-11-01 DIAGNOSIS — E1122 Type 2 diabetes mellitus with diabetic chronic kidney disease: Secondary | ICD-10-CM | POA: Diagnosis not present

## 2015-11-01 DIAGNOSIS — I119 Hypertensive heart disease without heart failure: Secondary | ICD-10-CM | POA: Diagnosis not present

## 2015-11-01 DIAGNOSIS — I129 Hypertensive chronic kidney disease with stage 1 through stage 4 chronic kidney disease, or unspecified chronic kidney disease: Secondary | ICD-10-CM | POA: Diagnosis not present

## 2015-11-01 DIAGNOSIS — Z89421 Acquired absence of other right toe(s): Secondary | ICD-10-CM | POA: Diagnosis not present

## 2015-11-01 DIAGNOSIS — Z794 Long term (current) use of insulin: Secondary | ICD-10-CM | POA: Diagnosis not present

## 2015-11-01 DIAGNOSIS — R079 Chest pain, unspecified: Secondary | ICD-10-CM | POA: Diagnosis not present

## 2015-11-01 DIAGNOSIS — E119 Type 2 diabetes mellitus without complications: Secondary | ICD-10-CM | POA: Diagnosis not present

## 2015-11-01 DIAGNOSIS — E1165 Type 2 diabetes mellitus with hyperglycemia: Secondary | ICD-10-CM | POA: Diagnosis not present

## 2015-11-01 DIAGNOSIS — R825 Elevated urine levels of drugs, medicaments and biological substances: Secondary | ICD-10-CM | POA: Diagnosis not present

## 2015-11-01 DIAGNOSIS — R0789 Other chest pain: Secondary | ICD-10-CM | POA: Diagnosis not present

## 2015-11-01 DIAGNOSIS — Z7982 Long term (current) use of aspirin: Secondary | ICD-10-CM | POA: Diagnosis not present

## 2015-11-01 DIAGNOSIS — K219 Gastro-esophageal reflux disease without esophagitis: Secondary | ICD-10-CM | POA: Diagnosis not present

## 2015-11-01 DIAGNOSIS — Z89422 Acquired absence of other left toe(s): Secondary | ICD-10-CM | POA: Diagnosis not present

## 2015-11-01 DIAGNOSIS — E785 Hyperlipidemia, unspecified: Secondary | ICD-10-CM | POA: Diagnosis not present

## 2015-11-02 DIAGNOSIS — R079 Chest pain, unspecified: Secondary | ICD-10-CM | POA: Diagnosis not present

## 2015-11-02 DIAGNOSIS — E669 Obesity, unspecified: Secondary | ICD-10-CM | POA: Diagnosis not present

## 2015-11-02 DIAGNOSIS — E1122 Type 2 diabetes mellitus with diabetic chronic kidney disease: Secondary | ICD-10-CM | POA: Diagnosis not present

## 2015-11-02 DIAGNOSIS — I119 Hypertensive heart disease without heart failure: Secondary | ICD-10-CM | POA: Diagnosis not present

## 2015-11-02 DIAGNOSIS — R0789 Other chest pain: Secondary | ICD-10-CM | POA: Diagnosis not present

## 2015-11-02 DIAGNOSIS — E119 Type 2 diabetes mellitus without complications: Secondary | ICD-10-CM | POA: Diagnosis not present

## 2015-11-02 DIAGNOSIS — R825 Elevated urine levels of drugs, medicaments and biological substances: Secondary | ICD-10-CM | POA: Diagnosis not present

## 2015-11-09 DIAGNOSIS — R0789 Other chest pain: Secondary | ICD-10-CM | POA: Diagnosis not present

## 2015-11-09 DIAGNOSIS — N189 Chronic kidney disease, unspecified: Secondary | ICD-10-CM | POA: Diagnosis not present

## 2015-11-09 DIAGNOSIS — E1165 Type 2 diabetes mellitus with hyperglycemia: Secondary | ICD-10-CM | POA: Diagnosis not present

## 2015-11-17 DIAGNOSIS — E1165 Type 2 diabetes mellitus with hyperglycemia: Secondary | ICD-10-CM | POA: Diagnosis not present

## 2015-11-17 DIAGNOSIS — N189 Chronic kidney disease, unspecified: Secondary | ICD-10-CM | POA: Diagnosis not present

## 2015-12-28 DIAGNOSIS — E1165 Type 2 diabetes mellitus with hyperglycemia: Secondary | ICD-10-CM | POA: Diagnosis not present

## 2015-12-28 DIAGNOSIS — Z89432 Acquired absence of left foot: Secondary | ICD-10-CM | POA: Diagnosis not present

## 2015-12-28 DIAGNOSIS — E11610 Type 2 diabetes mellitus with diabetic neuropathic arthropathy: Secondary | ICD-10-CM | POA: Diagnosis not present

## 2015-12-28 DIAGNOSIS — Z89411 Acquired absence of right great toe: Secondary | ICD-10-CM | POA: Diagnosis not present

## 2016-01-21 DIAGNOSIS — I10 Essential (primary) hypertension: Secondary | ICD-10-CM | POA: Diagnosis not present

## 2016-01-21 DIAGNOSIS — E1143 Type 2 diabetes mellitus with diabetic autonomic (poly)neuropathy: Secondary | ICD-10-CM | POA: Diagnosis not present

## 2016-01-21 DIAGNOSIS — E11610 Type 2 diabetes mellitus with diabetic neuropathic arthropathy: Secondary | ICD-10-CM | POA: Diagnosis not present

## 2016-01-21 DIAGNOSIS — Z89432 Acquired absence of left foot: Secondary | ICD-10-CM | POA: Diagnosis not present

## 2016-01-21 DIAGNOSIS — Z89431 Acquired absence of right foot: Secondary | ICD-10-CM | POA: Diagnosis not present

## 2016-02-23 DIAGNOSIS — E1165 Type 2 diabetes mellitus with hyperglycemia: Secondary | ICD-10-CM | POA: Diagnosis not present

## 2016-02-23 DIAGNOSIS — Z0000 Encounter for general adult medical examination without abnormal findings: Secondary | ICD-10-CM | POA: Diagnosis not present

## 2016-02-23 DIAGNOSIS — N189 Chronic kidney disease, unspecified: Secondary | ICD-10-CM | POA: Diagnosis not present

## 2016-03-15 DIAGNOSIS — E1122 Type 2 diabetes mellitus with diabetic chronic kidney disease: Secondary | ICD-10-CM | POA: Diagnosis not present

## 2016-03-15 DIAGNOSIS — N2581 Secondary hyperparathyroidism of renal origin: Secondary | ICD-10-CM | POA: Diagnosis not present

## 2016-03-15 DIAGNOSIS — R7989 Other specified abnormal findings of blood chemistry: Secondary | ICD-10-CM | POA: Diagnosis not present

## 2016-03-15 DIAGNOSIS — N183 Chronic kidney disease, stage 3 (moderate): Secondary | ICD-10-CM | POA: Diagnosis not present

## 2016-03-24 DIAGNOSIS — R079 Chest pain, unspecified: Secondary | ICD-10-CM | POA: Diagnosis not present

## 2016-03-24 DIAGNOSIS — E1165 Type 2 diabetes mellitus with hyperglycemia: Secondary | ICD-10-CM | POA: Diagnosis not present

## 2016-03-24 DIAGNOSIS — R7309 Other abnormal glucose: Secondary | ICD-10-CM | POA: Diagnosis not present

## 2016-05-19 DIAGNOSIS — L603 Nail dystrophy: Secondary | ICD-10-CM | POA: Diagnosis not present

## 2016-05-19 DIAGNOSIS — Z89431 Acquired absence of right foot: Secondary | ICD-10-CM | POA: Diagnosis not present

## 2016-05-19 DIAGNOSIS — Z89432 Acquired absence of left foot: Secondary | ICD-10-CM | POA: Diagnosis not present

## 2016-05-19 DIAGNOSIS — I10 Essential (primary) hypertension: Secondary | ICD-10-CM | POA: Diagnosis not present

## 2016-05-19 DIAGNOSIS — E1143 Type 2 diabetes mellitus with diabetic autonomic (poly)neuropathy: Secondary | ICD-10-CM | POA: Diagnosis not present

## 2016-07-19 DIAGNOSIS — Z794 Long term (current) use of insulin: Secondary | ICD-10-CM | POA: Diagnosis not present

## 2016-07-19 DIAGNOSIS — E1142 Type 2 diabetes mellitus with diabetic polyneuropathy: Secondary | ICD-10-CM | POA: Diagnosis not present

## 2016-07-19 DIAGNOSIS — E782 Mixed hyperlipidemia: Secondary | ICD-10-CM

## 2016-07-19 DIAGNOSIS — E669 Obesity, unspecified: Secondary | ICD-10-CM | POA: Insufficient documentation

## 2016-07-19 DIAGNOSIS — E1165 Type 2 diabetes mellitus with hyperglycemia: Secondary | ICD-10-CM | POA: Diagnosis not present

## 2016-07-19 DIAGNOSIS — I129 Hypertensive chronic kidney disease with stage 1 through stage 4 chronic kidney disease, or unspecified chronic kidney disease: Secondary | ICD-10-CM | POA: Diagnosis not present

## 2016-07-19 DIAGNOSIS — E1121 Type 2 diabetes mellitus with diabetic nephropathy: Secondary | ICD-10-CM

## 2016-07-19 DIAGNOSIS — N183 Chronic kidney disease, stage 3 (moderate): Secondary | ICD-10-CM | POA: Diagnosis not present

## 2016-07-19 HISTORY — DX: Obesity, unspecified: E66.9

## 2016-07-19 HISTORY — DX: Type 2 diabetes mellitus with diabetic nephropathy: E11.21

## 2016-07-19 HISTORY — DX: Mixed hyperlipidemia: E78.2

## 2016-08-02 DIAGNOSIS — E1122 Type 2 diabetes mellitus with diabetic chronic kidney disease: Secondary | ICD-10-CM | POA: Diagnosis not present

## 2016-08-02 DIAGNOSIS — N2581 Secondary hyperparathyroidism of renal origin: Secondary | ICD-10-CM | POA: Diagnosis not present

## 2016-08-02 DIAGNOSIS — N183 Chronic kidney disease, stage 3 (moderate): Secondary | ICD-10-CM | POA: Diagnosis not present

## 2016-08-02 DIAGNOSIS — R7989 Other specified abnormal findings of blood chemistry: Secondary | ICD-10-CM | POA: Diagnosis not present

## 2016-09-19 DIAGNOSIS — R000 Tachycardia, unspecified: Secondary | ICD-10-CM | POA: Diagnosis not present

## 2016-09-19 DIAGNOSIS — R51 Headache: Secondary | ICD-10-CM | POA: Diagnosis not present

## 2016-09-19 DIAGNOSIS — I10 Essential (primary) hypertension: Secondary | ICD-10-CM | POA: Diagnosis not present

## 2016-09-19 DIAGNOSIS — R110 Nausea: Secondary | ICD-10-CM | POA: Diagnosis not present

## 2016-09-19 DIAGNOSIS — E119 Type 2 diabetes mellitus without complications: Secondary | ICD-10-CM | POA: Diagnosis not present

## 2016-09-19 DIAGNOSIS — R109 Unspecified abdominal pain: Secondary | ICD-10-CM | POA: Diagnosis not present

## 2016-09-19 DIAGNOSIS — R12 Heartburn: Secondary | ICD-10-CM | POA: Diagnosis not present

## 2016-09-19 DIAGNOSIS — R1084 Generalized abdominal pain: Secondary | ICD-10-CM | POA: Diagnosis not present

## 2016-09-19 DIAGNOSIS — R197 Diarrhea, unspecified: Secondary | ICD-10-CM | POA: Diagnosis not present

## 2016-10-06 DIAGNOSIS — Z89431 Acquired absence of right foot: Secondary | ICD-10-CM | POA: Diagnosis not present

## 2016-10-06 DIAGNOSIS — M21372 Foot drop, left foot: Secondary | ICD-10-CM | POA: Diagnosis not present

## 2016-10-06 DIAGNOSIS — E1143 Type 2 diabetes mellitus with diabetic autonomic (poly)neuropathy: Secondary | ICD-10-CM | POA: Diagnosis not present

## 2016-10-06 DIAGNOSIS — I10 Essential (primary) hypertension: Secondary | ICD-10-CM | POA: Diagnosis not present

## 2016-10-06 DIAGNOSIS — Z89432 Acquired absence of left foot: Secondary | ICD-10-CM | POA: Diagnosis not present

## 2016-11-16 DIAGNOSIS — Z0000 Encounter for general adult medical examination without abnormal findings: Secondary | ICD-10-CM | POA: Diagnosis not present

## 2016-11-16 DIAGNOSIS — N189 Chronic kidney disease, unspecified: Secondary | ICD-10-CM | POA: Diagnosis not present

## 2016-11-24 DIAGNOSIS — I10 Essential (primary) hypertension: Secondary | ICD-10-CM | POA: Diagnosis not present

## 2016-11-24 DIAGNOSIS — L97429 Non-pressure chronic ulcer of left heel and midfoot with unspecified severity: Secondary | ICD-10-CM | POA: Diagnosis not present

## 2016-11-24 DIAGNOSIS — Z89432 Acquired absence of left foot: Secondary | ICD-10-CM | POA: Diagnosis not present

## 2016-11-24 DIAGNOSIS — E1143 Type 2 diabetes mellitus with diabetic autonomic (poly)neuropathy: Secondary | ICD-10-CM | POA: Diagnosis not present

## 2017-01-05 DIAGNOSIS — Z89432 Acquired absence of left foot: Secondary | ICD-10-CM | POA: Diagnosis not present

## 2017-01-05 DIAGNOSIS — E11610 Type 2 diabetes mellitus with diabetic neuropathic arthropathy: Secondary | ICD-10-CM | POA: Diagnosis not present

## 2017-01-05 DIAGNOSIS — Z89411 Acquired absence of right great toe: Secondary | ICD-10-CM | POA: Diagnosis not present

## 2017-01-09 DIAGNOSIS — Z89432 Acquired absence of left foot: Secondary | ICD-10-CM | POA: Diagnosis not present

## 2017-01-09 DIAGNOSIS — L97529 Non-pressure chronic ulcer of other part of left foot with unspecified severity: Secondary | ICD-10-CM | POA: Diagnosis not present

## 2017-01-09 DIAGNOSIS — M14672 Charcot's joint, left ankle and foot: Secondary | ICD-10-CM | POA: Diagnosis not present

## 2017-01-09 DIAGNOSIS — I10 Essential (primary) hypertension: Secondary | ICD-10-CM | POA: Diagnosis not present

## 2017-01-17 DIAGNOSIS — L97529 Non-pressure chronic ulcer of other part of left foot with unspecified severity: Secondary | ICD-10-CM | POA: Diagnosis not present

## 2017-01-17 DIAGNOSIS — Z89432 Acquired absence of left foot: Secondary | ICD-10-CM | POA: Diagnosis not present

## 2017-01-23 DIAGNOSIS — E11621 Type 2 diabetes mellitus with foot ulcer: Secondary | ICD-10-CM | POA: Diagnosis not present

## 2017-01-23 DIAGNOSIS — Z89432 Acquired absence of left foot: Secondary | ICD-10-CM | POA: Diagnosis not present

## 2017-01-23 DIAGNOSIS — I10 Essential (primary) hypertension: Secondary | ICD-10-CM | POA: Diagnosis not present

## 2017-01-23 DIAGNOSIS — L97529 Non-pressure chronic ulcer of other part of left foot with unspecified severity: Secondary | ICD-10-CM | POA: Diagnosis not present

## 2017-01-30 DIAGNOSIS — E11621 Type 2 diabetes mellitus with foot ulcer: Secondary | ICD-10-CM | POA: Diagnosis not present

## 2017-01-30 DIAGNOSIS — L97529 Non-pressure chronic ulcer of other part of left foot with unspecified severity: Secondary | ICD-10-CM | POA: Diagnosis not present

## 2017-02-08 DIAGNOSIS — I10 Essential (primary) hypertension: Secondary | ICD-10-CM | POA: Diagnosis not present

## 2017-02-08 DIAGNOSIS — I779 Disorder of arteries and arterioles, unspecified: Secondary | ICD-10-CM | POA: Diagnosis not present

## 2017-02-08 DIAGNOSIS — E11621 Type 2 diabetes mellitus with foot ulcer: Secondary | ICD-10-CM | POA: Diagnosis not present

## 2017-02-08 DIAGNOSIS — M14672 Charcot's joint, left ankle and foot: Secondary | ICD-10-CM | POA: Diagnosis not present

## 2017-02-08 DIAGNOSIS — L97529 Non-pressure chronic ulcer of other part of left foot with unspecified severity: Secondary | ICD-10-CM | POA: Diagnosis not present

## 2017-02-15 DIAGNOSIS — L97529 Non-pressure chronic ulcer of other part of left foot with unspecified severity: Secondary | ICD-10-CM | POA: Diagnosis not present

## 2017-02-23 DIAGNOSIS — I10 Essential (primary) hypertension: Secondary | ICD-10-CM | POA: Diagnosis not present

## 2017-02-23 DIAGNOSIS — E1043 Type 1 diabetes mellitus with diabetic autonomic (poly)neuropathy: Secondary | ICD-10-CM | POA: Diagnosis not present

## 2017-02-23 DIAGNOSIS — M14672 Charcot's joint, left ankle and foot: Secondary | ICD-10-CM | POA: Diagnosis not present

## 2017-02-23 DIAGNOSIS — Z89439 Acquired absence of unspecified foot: Secondary | ICD-10-CM | POA: Diagnosis not present

## 2017-02-26 DIAGNOSIS — E1143 Type 2 diabetes mellitus with diabetic autonomic (poly)neuropathy: Secondary | ICD-10-CM

## 2017-02-26 DIAGNOSIS — Z89439 Acquired absence of unspecified foot: Secondary | ICD-10-CM

## 2017-02-26 HISTORY — DX: Type 2 diabetes mellitus with diabetic autonomic (poly)neuropathy: E11.43

## 2017-02-26 HISTORY — DX: Acquired absence of unspecified foot: Z89.439

## 2017-02-27 DIAGNOSIS — Z1331 Encounter for screening for depression: Secondary | ICD-10-CM | POA: Diagnosis not present

## 2017-02-27 DIAGNOSIS — E1165 Type 2 diabetes mellitus with hyperglycemia: Secondary | ICD-10-CM | POA: Diagnosis not present

## 2017-02-27 DIAGNOSIS — Z79899 Other long term (current) drug therapy: Secondary | ICD-10-CM | POA: Diagnosis not present

## 2017-02-27 DIAGNOSIS — Z1339 Encounter for screening examination for other mental health and behavioral disorders: Secondary | ICD-10-CM | POA: Diagnosis not present

## 2017-02-27 DIAGNOSIS — Z0000 Encounter for general adult medical examination without abnormal findings: Secondary | ICD-10-CM | POA: Diagnosis not present

## 2017-02-27 DIAGNOSIS — Z125 Encounter for screening for malignant neoplasm of prostate: Secondary | ICD-10-CM | POA: Diagnosis not present

## 2017-02-27 DIAGNOSIS — D510 Vitamin B12 deficiency anemia due to intrinsic factor deficiency: Secondary | ICD-10-CM | POA: Diagnosis not present

## 2017-03-15 DIAGNOSIS — N189 Chronic kidney disease, unspecified: Secondary | ICD-10-CM | POA: Diagnosis not present

## 2017-03-15 DIAGNOSIS — E7800 Pure hypercholesterolemia, unspecified: Secondary | ICD-10-CM | POA: Diagnosis not present

## 2017-03-15 DIAGNOSIS — E1165 Type 2 diabetes mellitus with hyperglycemia: Secondary | ICD-10-CM | POA: Diagnosis not present

## 2017-04-19 DIAGNOSIS — E1165 Type 2 diabetes mellitus with hyperglycemia: Secondary | ICD-10-CM | POA: Diagnosis not present

## 2017-04-19 DIAGNOSIS — M109 Gout, unspecified: Secondary | ICD-10-CM | POA: Diagnosis not present

## 2017-04-19 DIAGNOSIS — K219 Gastro-esophageal reflux disease without esophagitis: Secondary | ICD-10-CM | POA: Diagnosis not present

## 2017-04-19 DIAGNOSIS — N189 Chronic kidney disease, unspecified: Secondary | ICD-10-CM | POA: Diagnosis not present

## 2017-04-27 DIAGNOSIS — M14672 Charcot's joint, left ankle and foot: Secondary | ICD-10-CM | POA: Diagnosis not present

## 2017-04-27 DIAGNOSIS — Z89439 Acquired absence of unspecified foot: Secondary | ICD-10-CM | POA: Diagnosis not present

## 2017-04-27 DIAGNOSIS — I10 Essential (primary) hypertension: Secondary | ICD-10-CM | POA: Diagnosis not present

## 2017-04-27 DIAGNOSIS — E1143 Type 2 diabetes mellitus with diabetic autonomic (poly)neuropathy: Secondary | ICD-10-CM | POA: Diagnosis not present

## 2017-04-27 DIAGNOSIS — L84 Corns and callosities: Secondary | ICD-10-CM | POA: Diagnosis not present

## 2017-04-29 DIAGNOSIS — L84 Corns and callosities: Secondary | ICD-10-CM

## 2017-04-29 HISTORY — DX: Corns and callosities: L84

## 2017-05-10 DIAGNOSIS — Z89432 Acquired absence of left foot: Secondary | ICD-10-CM | POA: Diagnosis not present

## 2017-05-10 DIAGNOSIS — Z89411 Acquired absence of right great toe: Secondary | ICD-10-CM | POA: Diagnosis not present

## 2017-05-10 DIAGNOSIS — M14672 Charcot's joint, left ankle and foot: Secondary | ICD-10-CM | POA: Diagnosis not present

## 2017-06-14 DIAGNOSIS — M109 Gout, unspecified: Secondary | ICD-10-CM | POA: Diagnosis not present

## 2017-06-14 DIAGNOSIS — E1165 Type 2 diabetes mellitus with hyperglycemia: Secondary | ICD-10-CM | POA: Diagnosis not present

## 2017-06-18 DIAGNOSIS — R51 Headache: Secondary | ICD-10-CM | POA: Diagnosis not present

## 2017-06-18 DIAGNOSIS — R030 Elevated blood-pressure reading, without diagnosis of hypertension: Secondary | ICD-10-CM | POA: Diagnosis not present

## 2017-06-18 DIAGNOSIS — I10 Essential (primary) hypertension: Secondary | ICD-10-CM | POA: Diagnosis not present

## 2017-06-18 DIAGNOSIS — I160 Hypertensive urgency: Secondary | ICD-10-CM | POA: Diagnosis not present

## 2017-06-23 DIAGNOSIS — N189 Chronic kidney disease, unspecified: Secondary | ICD-10-CM | POA: Diagnosis not present

## 2017-06-23 DIAGNOSIS — E1165 Type 2 diabetes mellitus with hyperglycemia: Secondary | ICD-10-CM | POA: Diagnosis not present

## 2017-06-23 DIAGNOSIS — Z6841 Body Mass Index (BMI) 40.0 and over, adult: Secondary | ICD-10-CM | POA: Diagnosis not present

## 2017-07-03 DIAGNOSIS — M14672 Charcot's joint, left ankle and foot: Secondary | ICD-10-CM | POA: Diagnosis not present

## 2017-07-03 DIAGNOSIS — Z89432 Acquired absence of left foot: Secondary | ICD-10-CM | POA: Diagnosis not present

## 2017-08-17 DIAGNOSIS — E1165 Type 2 diabetes mellitus with hyperglycemia: Secondary | ICD-10-CM | POA: Diagnosis not present

## 2017-08-22 DIAGNOSIS — Z89432 Acquired absence of left foot: Secondary | ICD-10-CM | POA: Diagnosis not present

## 2017-08-22 DIAGNOSIS — E875 Hyperkalemia: Secondary | ICD-10-CM | POA: Diagnosis not present

## 2017-08-22 DIAGNOSIS — E1143 Type 2 diabetes mellitus with diabetic autonomic (poly)neuropathy: Secondary | ICD-10-CM | POA: Diagnosis not present

## 2017-08-22 DIAGNOSIS — N183 Chronic kidney disease, stage 3 (moderate): Secondary | ICD-10-CM | POA: Diagnosis not present

## 2017-08-22 DIAGNOSIS — N2581 Secondary hyperparathyroidism of renal origin: Secondary | ICD-10-CM | POA: Diagnosis not present

## 2017-08-22 DIAGNOSIS — E1122 Type 2 diabetes mellitus with diabetic chronic kidney disease: Secondary | ICD-10-CM | POA: Diagnosis not present

## 2017-08-22 DIAGNOSIS — I129 Hypertensive chronic kidney disease with stage 1 through stage 4 chronic kidney disease, or unspecified chronic kidney disease: Secondary | ICD-10-CM | POA: Diagnosis not present

## 2017-08-28 DIAGNOSIS — E1165 Type 2 diabetes mellitus with hyperglycemia: Secondary | ICD-10-CM | POA: Diagnosis not present

## 2017-08-28 DIAGNOSIS — Z6841 Body Mass Index (BMI) 40.0 and over, adult: Secondary | ICD-10-CM | POA: Diagnosis not present

## 2017-08-28 DIAGNOSIS — R635 Abnormal weight gain: Secondary | ICD-10-CM | POA: Diagnosis not present

## 2017-08-28 DIAGNOSIS — N189 Chronic kidney disease, unspecified: Secondary | ICD-10-CM | POA: Diagnosis not present

## 2017-09-07 DIAGNOSIS — M14672 Charcot's joint, left ankle and foot: Secondary | ICD-10-CM | POA: Diagnosis not present

## 2017-09-07 DIAGNOSIS — Z89411 Acquired absence of right great toe: Secondary | ICD-10-CM | POA: Diagnosis not present

## 2017-09-07 DIAGNOSIS — M14671 Charcot's joint, right ankle and foot: Secondary | ICD-10-CM | POA: Diagnosis not present

## 2017-09-07 DIAGNOSIS — Z89439 Acquired absence of unspecified foot: Secondary | ICD-10-CM | POA: Diagnosis not present

## 2017-09-21 DIAGNOSIS — E1165 Type 2 diabetes mellitus with hyperglycemia: Secondary | ICD-10-CM | POA: Diagnosis not present

## 2017-09-25 DIAGNOSIS — M14671 Charcot's joint, right ankle and foot: Secondary | ICD-10-CM | POA: Diagnosis not present

## 2017-09-25 DIAGNOSIS — Z89411 Acquired absence of right great toe: Secondary | ICD-10-CM | POA: Diagnosis not present

## 2017-09-28 DIAGNOSIS — Z6839 Body mass index (BMI) 39.0-39.9, adult: Secondary | ICD-10-CM | POA: Diagnosis not present

## 2017-09-28 DIAGNOSIS — Z1339 Encounter for screening examination for other mental health and behavioral disorders: Secondary | ICD-10-CM | POA: Diagnosis not present

## 2017-09-28 DIAGNOSIS — N189 Chronic kidney disease, unspecified: Secondary | ICD-10-CM | POA: Diagnosis not present

## 2017-09-28 DIAGNOSIS — E1165 Type 2 diabetes mellitus with hyperglycemia: Secondary | ICD-10-CM | POA: Diagnosis not present

## 2017-10-20 DIAGNOSIS — E1165 Type 2 diabetes mellitus with hyperglycemia: Secondary | ICD-10-CM | POA: Diagnosis not present

## 2017-10-20 DIAGNOSIS — N189 Chronic kidney disease, unspecified: Secondary | ICD-10-CM | POA: Diagnosis not present

## 2017-10-20 DIAGNOSIS — Z6839 Body mass index (BMI) 39.0-39.9, adult: Secondary | ICD-10-CM | POA: Diagnosis not present

## 2017-10-24 DIAGNOSIS — E875 Hyperkalemia: Secondary | ICD-10-CM | POA: Diagnosis not present

## 2017-10-24 DIAGNOSIS — N183 Chronic kidney disease, stage 3 (moderate): Secondary | ICD-10-CM | POA: Diagnosis not present

## 2017-10-24 DIAGNOSIS — E1122 Type 2 diabetes mellitus with diabetic chronic kidney disease: Secondary | ICD-10-CM | POA: Diagnosis not present

## 2017-10-24 DIAGNOSIS — E1143 Type 2 diabetes mellitus with diabetic autonomic (poly)neuropathy: Secondary | ICD-10-CM | POA: Diagnosis not present

## 2017-10-24 DIAGNOSIS — Z89432 Acquired absence of left foot: Secondary | ICD-10-CM | POA: Diagnosis not present

## 2017-10-24 DIAGNOSIS — N2581 Secondary hyperparathyroidism of renal origin: Secondary | ICD-10-CM | POA: Diagnosis not present

## 2017-10-24 DIAGNOSIS — I129 Hypertensive chronic kidney disease with stage 1 through stage 4 chronic kidney disease, or unspecified chronic kidney disease: Secondary | ICD-10-CM | POA: Diagnosis not present

## 2017-11-07 DIAGNOSIS — E1165 Type 2 diabetes mellitus with hyperglycemia: Secondary | ICD-10-CM | POA: Diagnosis not present

## 2017-11-08 DIAGNOSIS — E1165 Type 2 diabetes mellitus with hyperglycemia: Secondary | ICD-10-CM | POA: Diagnosis not present

## 2017-11-08 DIAGNOSIS — N189 Chronic kidney disease, unspecified: Secondary | ICD-10-CM | POA: Diagnosis not present

## 2017-11-22 DIAGNOSIS — E1165 Type 2 diabetes mellitus with hyperglycemia: Secondary | ICD-10-CM | POA: Diagnosis not present

## 2017-12-07 DIAGNOSIS — E1165 Type 2 diabetes mellitus with hyperglycemia: Secondary | ICD-10-CM | POA: Diagnosis not present

## 2017-12-07 DIAGNOSIS — I10 Essential (primary) hypertension: Secondary | ICD-10-CM | POA: Diagnosis not present

## 2017-12-07 DIAGNOSIS — M109 Gout, unspecified: Secondary | ICD-10-CM | POA: Diagnosis not present

## 2017-12-15 DIAGNOSIS — N189 Chronic kidney disease, unspecified: Secondary | ICD-10-CM | POA: Diagnosis not present

## 2017-12-15 DIAGNOSIS — E1165 Type 2 diabetes mellitus with hyperglycemia: Secondary | ICD-10-CM | POA: Diagnosis not present

## 2017-12-15 DIAGNOSIS — Z79899 Other long term (current) drug therapy: Secondary | ICD-10-CM | POA: Diagnosis not present

## 2017-12-15 DIAGNOSIS — E785 Hyperlipidemia, unspecified: Secondary | ICD-10-CM | POA: Diagnosis not present

## 2017-12-21 DIAGNOSIS — E1165 Type 2 diabetes mellitus with hyperglycemia: Secondary | ICD-10-CM | POA: Diagnosis not present

## 2017-12-21 DIAGNOSIS — Z6837 Body mass index (BMI) 37.0-37.9, adult: Secondary | ICD-10-CM | POA: Diagnosis not present

## 2017-12-21 DIAGNOSIS — N189 Chronic kidney disease, unspecified: Secondary | ICD-10-CM | POA: Diagnosis not present

## 2018-01-04 DIAGNOSIS — Z89432 Acquired absence of left foot: Secondary | ICD-10-CM | POA: Diagnosis not present

## 2018-01-04 DIAGNOSIS — M14672 Charcot's joint, left ankle and foot: Secondary | ICD-10-CM | POA: Diagnosis not present

## 2018-01-04 DIAGNOSIS — L84 Corns and callosities: Secondary | ICD-10-CM | POA: Diagnosis not present

## 2018-01-08 DIAGNOSIS — E1165 Type 2 diabetes mellitus with hyperglycemia: Secondary | ICD-10-CM | POA: Diagnosis not present

## 2018-01-08 DIAGNOSIS — N189 Chronic kidney disease, unspecified: Secondary | ICD-10-CM | POA: Diagnosis not present

## 2018-01-08 DIAGNOSIS — M109 Gout, unspecified: Secondary | ICD-10-CM | POA: Diagnosis not present

## 2018-01-08 DIAGNOSIS — D510 Vitamin B12 deficiency anemia due to intrinsic factor deficiency: Secondary | ICD-10-CM | POA: Diagnosis not present

## 2018-02-08 DIAGNOSIS — I10 Essential (primary) hypertension: Secondary | ICD-10-CM | POA: Diagnosis not present

## 2018-02-08 DIAGNOSIS — E1165 Type 2 diabetes mellitus with hyperglycemia: Secondary | ICD-10-CM | POA: Diagnosis not present

## 2018-02-22 DIAGNOSIS — L84 Corns and callosities: Secondary | ICD-10-CM | POA: Diagnosis not present

## 2018-02-22 DIAGNOSIS — E1143 Type 2 diabetes mellitus with diabetic autonomic (poly)neuropathy: Secondary | ICD-10-CM | POA: Diagnosis not present

## 2018-02-22 DIAGNOSIS — Z89432 Acquired absence of left foot: Secondary | ICD-10-CM | POA: Diagnosis not present

## 2018-02-22 DIAGNOSIS — M14672 Charcot's joint, left ankle and foot: Secondary | ICD-10-CM | POA: Diagnosis not present

## 2018-02-27 DIAGNOSIS — Z89432 Acquired absence of left foot: Secondary | ICD-10-CM | POA: Diagnosis not present

## 2018-02-27 DIAGNOSIS — E875 Hyperkalemia: Secondary | ICD-10-CM | POA: Diagnosis not present

## 2018-02-27 DIAGNOSIS — I129 Hypertensive chronic kidney disease with stage 1 through stage 4 chronic kidney disease, or unspecified chronic kidney disease: Secondary | ICD-10-CM | POA: Diagnosis not present

## 2018-02-27 DIAGNOSIS — N183 Chronic kidney disease, stage 3 (moderate): Secondary | ICD-10-CM | POA: Diagnosis not present

## 2018-02-27 DIAGNOSIS — N2581 Secondary hyperparathyroidism of renal origin: Secondary | ICD-10-CM | POA: Diagnosis not present

## 2018-02-27 DIAGNOSIS — E1143 Type 2 diabetes mellitus with diabetic autonomic (poly)neuropathy: Secondary | ICD-10-CM | POA: Diagnosis not present

## 2018-02-27 DIAGNOSIS — E1122 Type 2 diabetes mellitus with diabetic chronic kidney disease: Secondary | ICD-10-CM | POA: Diagnosis not present

## 2018-02-27 DIAGNOSIS — D631 Anemia in chronic kidney disease: Secondary | ICD-10-CM | POA: Diagnosis not present

## 2018-03-01 DIAGNOSIS — Z79899 Other long term (current) drug therapy: Secondary | ICD-10-CM | POA: Diagnosis not present

## 2018-03-01 DIAGNOSIS — E1165 Type 2 diabetes mellitus with hyperglycemia: Secondary | ICD-10-CM | POA: Diagnosis not present

## 2018-03-01 DIAGNOSIS — Z125 Encounter for screening for malignant neoplasm of prostate: Secondary | ICD-10-CM | POA: Diagnosis not present

## 2018-03-01 DIAGNOSIS — Z6837 Body mass index (BMI) 37.0-37.9, adult: Secondary | ICD-10-CM | POA: Diagnosis not present

## 2018-03-01 DIAGNOSIS — N189 Chronic kidney disease, unspecified: Secondary | ICD-10-CM | POA: Diagnosis not present

## 2018-03-01 DIAGNOSIS — Z1331 Encounter for screening for depression: Secondary | ICD-10-CM | POA: Diagnosis not present

## 2018-03-01 DIAGNOSIS — Z0000 Encounter for general adult medical examination without abnormal findings: Secondary | ICD-10-CM | POA: Diagnosis not present

## 2018-03-01 DIAGNOSIS — E785 Hyperlipidemia, unspecified: Secondary | ICD-10-CM | POA: Diagnosis not present

## 2018-03-10 DIAGNOSIS — D510 Vitamin B12 deficiency anemia due to intrinsic factor deficiency: Secondary | ICD-10-CM | POA: Diagnosis not present

## 2018-03-10 DIAGNOSIS — E1165 Type 2 diabetes mellitus with hyperglycemia: Secondary | ICD-10-CM | POA: Diagnosis not present

## 2018-03-10 DIAGNOSIS — M109 Gout, unspecified: Secondary | ICD-10-CM | POA: Diagnosis not present

## 2018-03-29 DIAGNOSIS — Z79899 Other long term (current) drug therapy: Secondary | ICD-10-CM | POA: Diagnosis not present

## 2018-03-29 DIAGNOSIS — E1165 Type 2 diabetes mellitus with hyperglycemia: Secondary | ICD-10-CM | POA: Diagnosis not present

## 2018-04-10 DIAGNOSIS — E785 Hyperlipidemia, unspecified: Secondary | ICD-10-CM | POA: Diagnosis not present

## 2018-04-10 DIAGNOSIS — M109 Gout, unspecified: Secondary | ICD-10-CM | POA: Diagnosis not present

## 2018-04-10 DIAGNOSIS — I10 Essential (primary) hypertension: Secondary | ICD-10-CM | POA: Diagnosis not present

## 2018-05-03 DIAGNOSIS — E1159 Type 2 diabetes mellitus with other circulatory complications: Secondary | ICD-10-CM | POA: Diagnosis not present

## 2018-05-03 DIAGNOSIS — Z6839 Body mass index (BMI) 39.0-39.9, adult: Secondary | ICD-10-CM | POA: Diagnosis not present

## 2018-05-03 DIAGNOSIS — E1165 Type 2 diabetes mellitus with hyperglycemia: Secondary | ICD-10-CM | POA: Diagnosis not present

## 2018-05-03 DIAGNOSIS — N189 Chronic kidney disease, unspecified: Secondary | ICD-10-CM | POA: Diagnosis not present

## 2018-05-03 DIAGNOSIS — I10 Essential (primary) hypertension: Secondary | ICD-10-CM | POA: Diagnosis not present

## 2018-05-10 DIAGNOSIS — E1143 Type 2 diabetes mellitus with diabetic autonomic (poly)neuropathy: Secondary | ICD-10-CM | POA: Diagnosis not present

## 2018-05-10 DIAGNOSIS — E785 Hyperlipidemia, unspecified: Secondary | ICD-10-CM | POA: Diagnosis not present

## 2018-05-10 DIAGNOSIS — I10 Essential (primary) hypertension: Secondary | ICD-10-CM | POA: Diagnosis not present

## 2018-05-17 DIAGNOSIS — M14672 Charcot's joint, left ankle and foot: Secondary | ICD-10-CM | POA: Diagnosis not present

## 2018-05-17 DIAGNOSIS — E1143 Type 2 diabetes mellitus with diabetic autonomic (poly)neuropathy: Secondary | ICD-10-CM | POA: Diagnosis not present

## 2018-05-17 DIAGNOSIS — Z89432 Acquired absence of left foot: Secondary | ICD-10-CM | POA: Diagnosis not present

## 2018-05-17 DIAGNOSIS — L84 Corns and callosities: Secondary | ICD-10-CM | POA: Diagnosis not present

## 2018-06-09 DIAGNOSIS — E1165 Type 2 diabetes mellitus with hyperglycemia: Secondary | ICD-10-CM | POA: Diagnosis not present

## 2018-06-09 DIAGNOSIS — D519 Vitamin B12 deficiency anemia, unspecified: Secondary | ICD-10-CM | POA: Diagnosis not present

## 2018-07-02 DIAGNOSIS — E1165 Type 2 diabetes mellitus with hyperglycemia: Secondary | ICD-10-CM | POA: Diagnosis not present

## 2018-07-03 DIAGNOSIS — I129 Hypertensive chronic kidney disease with stage 1 through stage 4 chronic kidney disease, or unspecified chronic kidney disease: Secondary | ICD-10-CM | POA: Diagnosis not present

## 2018-07-03 DIAGNOSIS — E875 Hyperkalemia: Secondary | ICD-10-CM | POA: Diagnosis not present

## 2018-07-03 DIAGNOSIS — E1143 Type 2 diabetes mellitus with diabetic autonomic (poly)neuropathy: Secondary | ICD-10-CM | POA: Diagnosis not present

## 2018-07-03 DIAGNOSIS — N183 Chronic kidney disease, stage 3 (moderate): Secondary | ICD-10-CM | POA: Diagnosis not present

## 2018-07-03 DIAGNOSIS — E1122 Type 2 diabetes mellitus with diabetic chronic kidney disease: Secondary | ICD-10-CM | POA: Diagnosis not present

## 2018-07-03 DIAGNOSIS — D631 Anemia in chronic kidney disease: Secondary | ICD-10-CM | POA: Diagnosis not present

## 2018-07-03 DIAGNOSIS — Z89432 Acquired absence of left foot: Secondary | ICD-10-CM | POA: Diagnosis not present

## 2018-07-03 DIAGNOSIS — N2581 Secondary hyperparathyroidism of renal origin: Secondary | ICD-10-CM | POA: Diagnosis not present

## 2018-07-10 DIAGNOSIS — E1165 Type 2 diabetes mellitus with hyperglycemia: Secondary | ICD-10-CM | POA: Diagnosis not present

## 2018-07-10 DIAGNOSIS — E785 Hyperlipidemia, unspecified: Secondary | ICD-10-CM | POA: Diagnosis not present

## 2018-07-17 DIAGNOSIS — W01198A Fall on same level from slipping, tripping and stumbling with subsequent striking against other object, initial encounter: Secondary | ICD-10-CM | POA: Diagnosis not present

## 2018-07-17 DIAGNOSIS — Z0000 Encounter for general adult medical examination without abnormal findings: Secondary | ICD-10-CM | POA: Diagnosis not present

## 2018-07-17 DIAGNOSIS — D72829 Elevated white blood cell count, unspecified: Secondary | ICD-10-CM | POA: Diagnosis not present

## 2018-07-17 DIAGNOSIS — Z9181 History of falling: Secondary | ICD-10-CM | POA: Diagnosis not present

## 2018-07-17 DIAGNOSIS — E7800 Pure hypercholesterolemia, unspecified: Secondary | ICD-10-CM | POA: Diagnosis not present

## 2018-07-17 DIAGNOSIS — Z6841 Body Mass Index (BMI) 40.0 and over, adult: Secondary | ICD-10-CM | POA: Diagnosis not present

## 2018-07-17 DIAGNOSIS — K589 Irritable bowel syndrome without diarrhea: Secondary | ICD-10-CM | POA: Diagnosis not present

## 2018-07-17 DIAGNOSIS — S42351A Displaced comminuted fracture of shaft of humerus, right arm, initial encounter for closed fracture: Secondary | ICD-10-CM | POA: Diagnosis not present

## 2018-07-17 DIAGNOSIS — Z1339 Encounter for screening examination for other mental health and behavioral disorders: Secondary | ICD-10-CM | POA: Diagnosis not present

## 2018-07-17 DIAGNOSIS — E785 Hyperlipidemia, unspecified: Secondary | ICD-10-CM | POA: Diagnosis not present

## 2018-07-17 DIAGNOSIS — E1169 Type 2 diabetes mellitus with other specified complication: Secondary | ICD-10-CM | POA: Diagnosis not present

## 2018-07-17 DIAGNOSIS — J84112 Idiopathic pulmonary fibrosis: Secondary | ICD-10-CM | POA: Diagnosis not present

## 2018-07-17 DIAGNOSIS — R05 Cough: Secondary | ICD-10-CM | POA: Diagnosis not present

## 2018-07-17 DIAGNOSIS — J329 Chronic sinusitis, unspecified: Secondary | ICD-10-CM | POA: Diagnosis not present

## 2018-07-17 DIAGNOSIS — E1159 Type 2 diabetes mellitus with other circulatory complications: Secondary | ICD-10-CM | POA: Diagnosis not present

## 2018-07-17 DIAGNOSIS — J9611 Chronic respiratory failure with hypoxia: Secondary | ICD-10-CM | POA: Diagnosis not present

## 2018-07-17 DIAGNOSIS — E1165 Type 2 diabetes mellitus with hyperglycemia: Secondary | ICD-10-CM | POA: Diagnosis not present

## 2018-07-17 DIAGNOSIS — K219 Gastro-esophageal reflux disease without esophagitis: Secondary | ICD-10-CM | POA: Diagnosis not present

## 2018-07-17 DIAGNOSIS — Z1331 Encounter for screening for depression: Secondary | ICD-10-CM | POA: Diagnosis not present

## 2018-07-17 DIAGNOSIS — I10 Essential (primary) hypertension: Secondary | ICD-10-CM | POA: Diagnosis not present

## 2018-07-17 DIAGNOSIS — J302 Other seasonal allergic rhinitis: Secondary | ICD-10-CM | POA: Diagnosis not present

## 2018-07-19 DIAGNOSIS — E1143 Type 2 diabetes mellitus with diabetic autonomic (poly)neuropathy: Secondary | ICD-10-CM | POA: Diagnosis not present

## 2018-07-19 DIAGNOSIS — Z89432 Acquired absence of left foot: Secondary | ICD-10-CM | POA: Diagnosis not present

## 2018-07-19 DIAGNOSIS — L84 Corns and callosities: Secondary | ICD-10-CM | POA: Diagnosis not present

## 2018-07-19 DIAGNOSIS — M14672 Charcot's joint, left ankle and foot: Secondary | ICD-10-CM | POA: Diagnosis not present

## 2018-08-09 DIAGNOSIS — E785 Hyperlipidemia, unspecified: Secondary | ICD-10-CM | POA: Diagnosis not present

## 2018-08-09 DIAGNOSIS — E1165 Type 2 diabetes mellitus with hyperglycemia: Secondary | ICD-10-CM | POA: Diagnosis not present

## 2018-09-04 DIAGNOSIS — N189 Chronic kidney disease, unspecified: Secondary | ICD-10-CM | POA: Diagnosis not present

## 2018-09-08 DIAGNOSIS — I10 Essential (primary) hypertension: Secondary | ICD-10-CM | POA: Diagnosis not present

## 2018-09-08 DIAGNOSIS — E1165 Type 2 diabetes mellitus with hyperglycemia: Secondary | ICD-10-CM | POA: Diagnosis not present

## 2018-09-27 DIAGNOSIS — L84 Corns and callosities: Secondary | ICD-10-CM | POA: Diagnosis not present

## 2018-09-27 DIAGNOSIS — Z89439 Acquired absence of unspecified foot: Secondary | ICD-10-CM | POA: Diagnosis not present

## 2018-09-27 DIAGNOSIS — M14672 Charcot's joint, left ankle and foot: Secondary | ICD-10-CM | POA: Diagnosis not present

## 2018-09-27 DIAGNOSIS — E1143 Type 2 diabetes mellitus with diabetic autonomic (poly)neuropathy: Secondary | ICD-10-CM | POA: Diagnosis not present

## 2018-09-27 DIAGNOSIS — Z794 Long term (current) use of insulin: Secondary | ICD-10-CM | POA: Diagnosis not present

## 2018-10-02 DIAGNOSIS — D631 Anemia in chronic kidney disease: Secondary | ICD-10-CM | POA: Diagnosis not present

## 2018-10-02 DIAGNOSIS — E1122 Type 2 diabetes mellitus with diabetic chronic kidney disease: Secondary | ICD-10-CM | POA: Diagnosis not present

## 2018-10-02 DIAGNOSIS — I129 Hypertensive chronic kidney disease with stage 1 through stage 4 chronic kidney disease, or unspecified chronic kidney disease: Secondary | ICD-10-CM | POA: Diagnosis not present

## 2018-10-02 DIAGNOSIS — N183 Chronic kidney disease, stage 3 (moderate): Secondary | ICD-10-CM | POA: Diagnosis not present

## 2018-10-02 DIAGNOSIS — Z89432 Acquired absence of left foot: Secondary | ICD-10-CM | POA: Diagnosis not present

## 2018-10-02 DIAGNOSIS — N2581 Secondary hyperparathyroidism of renal origin: Secondary | ICD-10-CM | POA: Diagnosis not present

## 2018-10-02 DIAGNOSIS — E1143 Type 2 diabetes mellitus with diabetic autonomic (poly)neuropathy: Secondary | ICD-10-CM | POA: Diagnosis not present

## 2018-10-02 DIAGNOSIS — E875 Hyperkalemia: Secondary | ICD-10-CM | POA: Diagnosis not present

## 2018-10-09 DIAGNOSIS — E1165 Type 2 diabetes mellitus with hyperglycemia: Secondary | ICD-10-CM | POA: Diagnosis not present

## 2018-10-09 DIAGNOSIS — I10 Essential (primary) hypertension: Secondary | ICD-10-CM | POA: Diagnosis not present

## 2018-10-09 DIAGNOSIS — E785 Hyperlipidemia, unspecified: Secondary | ICD-10-CM | POA: Diagnosis not present

## 2018-10-16 DIAGNOSIS — E1165 Type 2 diabetes mellitus with hyperglycemia: Secondary | ICD-10-CM | POA: Diagnosis not present

## 2018-10-23 DIAGNOSIS — E1159 Type 2 diabetes mellitus with other circulatory complications: Secondary | ICD-10-CM | POA: Diagnosis not present

## 2018-10-23 DIAGNOSIS — N189 Chronic kidney disease, unspecified: Secondary | ICD-10-CM | POA: Diagnosis not present

## 2018-10-23 DIAGNOSIS — E7800 Pure hypercholesterolemia, unspecified: Secondary | ICD-10-CM | POA: Diagnosis not present

## 2018-10-23 DIAGNOSIS — E1165 Type 2 diabetes mellitus with hyperglycemia: Secondary | ICD-10-CM | POA: Diagnosis not present

## 2018-10-23 DIAGNOSIS — I10 Essential (primary) hypertension: Secondary | ICD-10-CM | POA: Diagnosis not present

## 2018-10-23 DIAGNOSIS — Z6841 Body Mass Index (BMI) 40.0 and over, adult: Secondary | ICD-10-CM | POA: Diagnosis not present

## 2018-11-08 DIAGNOSIS — E1143 Type 2 diabetes mellitus with diabetic autonomic (poly)neuropathy: Secondary | ICD-10-CM | POA: Diagnosis not present

## 2018-11-08 DIAGNOSIS — Z89439 Acquired absence of unspecified foot: Secondary | ICD-10-CM | POA: Diagnosis not present

## 2018-11-08 DIAGNOSIS — M14672 Charcot's joint, left ankle and foot: Secondary | ICD-10-CM | POA: Diagnosis not present

## 2018-11-08 DIAGNOSIS — L84 Corns and callosities: Secondary | ICD-10-CM | POA: Diagnosis not present

## 2018-11-08 DIAGNOSIS — Z794 Long term (current) use of insulin: Secondary | ICD-10-CM | POA: Diagnosis not present

## 2018-11-09 DIAGNOSIS — N189 Chronic kidney disease, unspecified: Secondary | ICD-10-CM | POA: Diagnosis not present

## 2018-11-09 DIAGNOSIS — E785 Hyperlipidemia, unspecified: Secondary | ICD-10-CM | POA: Diagnosis not present

## 2018-11-09 DIAGNOSIS — E1159 Type 2 diabetes mellitus with other circulatory complications: Secondary | ICD-10-CM | POA: Diagnosis not present

## 2018-12-10 DIAGNOSIS — E785 Hyperlipidemia, unspecified: Secondary | ICD-10-CM | POA: Diagnosis not present

## 2018-12-10 DIAGNOSIS — I10 Essential (primary) hypertension: Secondary | ICD-10-CM | POA: Diagnosis not present

## 2018-12-20 DIAGNOSIS — N189 Chronic kidney disease, unspecified: Secondary | ICD-10-CM | POA: Diagnosis not present

## 2018-12-23 DIAGNOSIS — E1165 Type 2 diabetes mellitus with hyperglycemia: Secondary | ICD-10-CM | POA: Diagnosis not present

## 2018-12-23 DIAGNOSIS — Z9641 Presence of insulin pump (external) (internal): Secondary | ICD-10-CM | POA: Diagnosis not present

## 2018-12-23 DIAGNOSIS — Z885 Allergy status to narcotic agent status: Secondary | ICD-10-CM | POA: Diagnosis not present

## 2018-12-23 DIAGNOSIS — Z7982 Long term (current) use of aspirin: Secondary | ICD-10-CM | POA: Diagnosis not present

## 2018-12-23 DIAGNOSIS — Z9049 Acquired absence of other specified parts of digestive tract: Secondary | ICD-10-CM | POA: Diagnosis not present

## 2018-12-23 DIAGNOSIS — Z79899 Other long term (current) drug therapy: Secondary | ICD-10-CM | POA: Diagnosis not present

## 2018-12-23 DIAGNOSIS — Z794 Long term (current) use of insulin: Secondary | ICD-10-CM | POA: Diagnosis not present

## 2018-12-23 DIAGNOSIS — I10 Essential (primary) hypertension: Secondary | ICD-10-CM | POA: Diagnosis not present

## 2018-12-26 ENCOUNTER — Other Ambulatory Visit: Payer: Self-pay

## 2018-12-26 ENCOUNTER — Emergency Department (HOSPITAL_COMMUNITY): Payer: PPO

## 2018-12-26 ENCOUNTER — Emergency Department (HOSPITAL_COMMUNITY)
Admission: EM | Admit: 2018-12-26 | Discharge: 2018-12-26 | Disposition: A | Discharge status: Home / Self Care | Payer: PPO | Attending: Emergency Medicine | Admitting: Emergency Medicine

## 2018-12-26 ENCOUNTER — Encounter (HOSPITAL_COMMUNITY): Payer: Self-pay | Admitting: Emergency Medicine

## 2018-12-26 DIAGNOSIS — R000 Tachycardia, unspecified: Secondary | ICD-10-CM | POA: Diagnosis not present

## 2018-12-26 DIAGNOSIS — Z79899 Other long term (current) drug therapy: Secondary | ICD-10-CM | POA: Insufficient documentation

## 2018-12-26 DIAGNOSIS — I10 Essential (primary) hypertension: Secondary | ICD-10-CM

## 2018-12-26 DIAGNOSIS — E109 Type 1 diabetes mellitus without complications: Secondary | ICD-10-CM | POA: Diagnosis not present

## 2018-12-26 DIAGNOSIS — R0789 Other chest pain: Secondary | ICD-10-CM | POA: Insufficient documentation

## 2018-12-26 DIAGNOSIS — R079 Chest pain, unspecified: Secondary | ICD-10-CM | POA: Diagnosis not present

## 2018-12-26 LAB — HEPATIC FUNCTION PANEL
ALT: 53 U/L — ABNORMAL HIGH (ref 0–44)
AST: 39 U/L (ref 15–41)
Albumin: 3.5 g/dL (ref 3.5–5.0)
Alkaline Phosphatase: 88 U/L (ref 38–126)
Bilirubin, Direct: 0.1 mg/dL (ref 0.0–0.2)
Indirect Bilirubin: 0.5 mg/dL (ref 0.3–0.9)
Total Bilirubin: 0.6 mg/dL (ref 0.3–1.2)
Total Protein: 7.2 g/dL (ref 6.5–8.1)

## 2018-12-26 LAB — TROPONIN I (HIGH SENSITIVITY)
Troponin I (High Sensitivity): 5 ng/L (ref <18)
Troponin I (High Sensitivity): 5 ng/L (ref <18)

## 2018-12-26 LAB — BASIC METABOLIC PANEL
Anion gap: 11 (ref 5–15)
BUN: 25 mg/dL — ABNORMAL HIGH (ref 6–20)
CO2: 21 mmol/L — ABNORMAL LOW (ref 22–32)
Calcium: 8.9 mg/dL (ref 8.9–10.3)
Chloride: 104 mmol/L (ref 98–111)
Creatinine, Ser: 1.68 mg/dL — ABNORMAL HIGH (ref 0.61–1.24)
GFR calc Af Amer: 54 mL/min — ABNORMAL LOW (ref >60)
GFR calc non Af Amer: 47 mL/min — ABNORMAL LOW (ref >60)
Glucose, Bld: 262 mg/dL — ABNORMAL HIGH (ref 70–99)
Potassium: 4.4 mmol/L (ref 3.5–5.1)
Sodium: 136 mmol/L (ref 135–145)

## 2018-12-26 LAB — CBC
HCT: 42.5 % (ref 39.0–52.0)
Hemoglobin: 13.7 g/dL (ref 13.0–17.0)
MCH: 29 pg (ref 26.0–34.0)
MCHC: 32.2 g/dL (ref 30.0–36.0)
MCV: 90 fL (ref 80.0–100.0)
Platelets: 178 10*3/uL (ref 150–400)
RBC: 4.72 MIL/uL (ref 4.22–5.81)
RDW: 14.6 % (ref 11.5–15.5)
WBC: 7.5 10*3/uL (ref 4.0–10.5)
nRBC: 0 % (ref 0.0–0.2)

## 2018-12-26 LAB — LIPASE, BLOOD: Lipase: 21 U/L (ref 11–51)

## 2018-12-26 MED ORDER — PROMETHAZINE HCL 25 MG/ML IJ SOLN: 25.0000 mg | Freq: Once | INTRAMUSCULAR | Status: DC

## 2018-12-26 MED ORDER — ACETAMINOPHEN 500 MG PO TABS
1000.0000 mg | ORAL_TABLET | Freq: Once | ORAL | Status: AC
  Administered 2018-12-26: 15:00:00 1000 mg via ORAL
  Filled 2018-12-26: qty 2

## 2018-12-26 MED ORDER — AMLODIPINE BESYLATE 5 MG PO TABS: 5.0000 mg | ORAL_TABLET | Freq: Every day | ORAL | Status: DC

## 2018-12-26 MED ORDER — CLONIDINE HCL 0.1 MG PO TABS
0.1000 mg | ORAL_TABLET | Freq: Every day | ORAL | Status: DC
  Administered 2018-12-26: 0.1 mg via ORAL
  Filled 2018-12-26: qty 1

## 2018-12-26 MED ORDER — LABETALOL HCL 200 MG PO TABS
200.0000 mg | ORAL_TABLET | Freq: Two times a day (BID) | ORAL | Status: DC
  Administered 2018-12-26: 200 mg via ORAL
  Filled 2018-12-26: qty 1

## 2018-12-26 MED ORDER — SODIUM CHLORIDE 0.9 % IV BOLUS
500.0000 mL | Freq: Once | INTRAVENOUS | Status: AC
  Administered 2018-12-26: 500 mL via INTRAVENOUS

## 2018-12-26 MED ORDER — SODIUM CHLORIDE 0.9% FLUSH
3.0000 mL | Freq: Once | INTRAVENOUS | Status: AC
  Administered 2018-12-26: 3 mL via INTRAVENOUS

## 2018-12-26 NOTE — Discharge Instructions (Addendum)
You will need to call your nephrologist as soon as possible for reevaluation and recheck of your blood pressure and further management.  Your testing here today did not show any significant abnormalities

## 2018-12-26 NOTE — ED Provider Notes (Addendum)
[facility] EMERGENCY DEPARTMENT Provider Note   CSN: [id number] Arrival date & time: 12/26/18  0036     History   Chief Complaint Chief Complaint  Patient presents with  . Hypertension  . Chest Pain    HPI [name] is a 48 y.o. male.     HPI Patient presents to the emergency department with elevated blood pressure and he states that after eating over the last 2 to 3 days he is feeling a pressure sensation the patient states that he took his blood pressure medication last night and once he laid down he felt more the pressure so he came here for evaluation.  Patient states that he is not seen his nephrologist who manages his blood pressure in several months.  The patient states that he did see his primary doctor who was going to change his blood pressure medications but wanted to defer to his nephrologist.  Patient states that he did have a mild headache but no other significant abnormalities.  Patient states that the pressure in his chest seems to be mostly after eating.  The patient denies chest pain, shortness of breath, headache,blurred vision, neck pain, fever, cough, weakness, numbness, dizziness, anorexia, edema, abdominal pain, nausea, vomiting, diarrhea, rash, back pain, dysuria, hematemesis, bloody stool, near syncope, or syncope. Past Medical History:  Diagnosis Date  . Diabetes mellitus   . Hypertension     Patient Active Problem List   Diagnosis Date Noted  . DIABETES MELLITUS, TYPE I 08/19/2009  . HYPERTENSION 08/19/2009  . UNSPECIFIED OSTEOMYELITIS ANKLE AND FOOT 08/19/2009    Past Surgical History:  Procedure Laterality Date  . CHOLECYSTECTOMY    . TOE AMPUTATION          Home Medications    Prior to Admission medications   Medication Sig Start Date End Date Taking? Authorizing Provider  allopurinol (ZYLOPRIM) 100 MG tablet Take 100 mg by mouth daily.    [provider]  amLODipine (NORVASC) 5 MG tablet Take 5 mg by  mouth daily.    [provider]  cephALEXin (KEFLEX) 500 MG capsule Take 1 capsule (500 mg total) by mouth 4 (four) times daily. 02/18/12   Linton Flemings, MD  clindamycin (CLEOCIN) 300 MG capsule Take 1 capsule (300 mg total) by mouth 3 (three) times daily. 02/18/12   Linton Flemings, MD  furosemide (LASIX) 20 MG tablet Take 20 mg by mouth daily.    [provider]  insulin aspart (NOVOLOG) 100 UNIT/ML injection Inject 15 Units into the skin 3 (three) times daily before meals.     [provider]  insulin glargine (LANTUS) 100 UNIT/ML injection Inject 50 Units into the skin at bedtime.     [provider]  metoprolol succinate (TOPROL-XL) 50 MG 24 hr tablet Take 50 mg by mouth daily.     [provider]    Family History No family history on file.  Social History Social History   Tobacco Use  . Smoking status: Never Smoker  . Smokeless tobacco: Never Used  Substance Use Topics  . Alcohol use: Yes  . Drug use: No     Allergies   Hydromorphone and Lisinopril   Review of Systems Review of Systems All other systems negative except as documented in the HPI. All pertinent positives and negatives as reviewed in the HPI.  Physical Exam Updated Vital Signs BP (!) 177/100 (BP Location: Right Arm)   Pulse 99   Temp 98 F (36.7 C) (  Oral)   Resp 17   Ht 5\' 8"  (1.727 m)   Wt 124.7 kg   SpO2 97%   BMI 41.81 kg/m   Physical Exam Vitals signs and nursing note reviewed.  Constitutional:      General: He is not in acute distress.    Appearance: He is well-developed.  HENT:     Head: Normocephalic and atraumatic.  Eyes:     Pupils: Pupils are equal, round, and reactive to light.  Neck:     Musculoskeletal: Normal range of motion and neck supple.  Cardiovascular:     Rate and Rhythm: Normal rate and regular rhythm.     Heart sounds: Normal heart sounds. No murmur. No friction rub. No gallop.   Pulmonary:     Effort: Pulmonary effort is  normal. No respiratory distress.     Breath sounds: Normal breath sounds. No wheezing.  Abdominal:     General: Bowel sounds are normal. There is no distension.     Palpations: Abdomen is soft.     Tenderness: There is no abdominal tenderness.  Skin:    General: Skin is warm and dry.     Capillary Refill: Capillary refill takes less than 2 seconds.     Findings: No erythema or rash.  Neurological:     Mental Status: He is alert and oriented to person, place, and time.     Motor: No abnormal muscle tone.     Coordination: Coordination normal.  Psychiatric:        Behavior: Behavior normal.      ED Treatments / Results  Labs (all labs ordered are listed, but only abnormal results are displayed) Labs Reviewed  BASIC METABOLIC PANEL - Abnormal; Notable for the following components:      Result Value   CO2 21 (*)    Glucose, Bld 262 (*)    BUN 25 (*)    Creatinine, Ser 1.68 (*)    GFR calc non Af Amer 47 (*)    GFR calc Af Amer 54 (*)    All other components within normal limits  CBC  HEPATIC FUNCTION PANEL  LIPASE, BLOOD  TROPONIN I (HIGH SENSITIVITY)  TROPONIN I (HIGH SENSITIVITY)    EKG EKG Interpretation  Date/Time:  Wednesday December 26 2018 00:50:29 EDT Ventricular Rate:  101 PR Interval:  214 QRS Duration: 80 QT Interval:  350 QTC Calculation: 453 R Axis:   39 Text Interpretation:  Sinus tachycardia with 1st degree A-V block Nonspecific ST abnormality Abnormal ECG Confirmed by Ross MarcusHorton, Courtney ([id number]) on 12/26/2018 1:08:22 AM   Radiology Dg Chest 2 View  Result Date: 12/26/2018 CLINICAL DATA:  Chest pain, elevated blood pressure EXAM: CHEST - 2 VIEW COMPARISON:  Radiographs 03/24/2016, 11/01/2015, 09/02/2013 FINDINGS: No consolidation, features of edema, pneumothorax, or effusion. Pulmonary vascularity is normally distributed. The cardiomediastinal contours are unremarkable. No acute osseous or soft tissue abnormality. IMPRESSION: No active cardiopulmonary  disease. Electronically Signed   By: Kreg ShropshirePrice  DeHay M.D.   On: 12/26/2018 01:59    Procedures Procedures (including critical care time)  Medications Ordered in ED Medications  sodium chloride 0.9 % bolus 500 mL (has no administration in time range)  sodium chloride flush (NS) 0.9 % injection 3 mL (3 mLs Intravenous Given 12/26/18 0949)     Initial Impression / Assessment and Plan / ED Course  I have reviewed the triage vital signs and the nursing notes.  Pertinent labs & imaging results that were available during my care of  the patient were reviewed by me and considered in my medical decision making (see chart for details).        I feel the patient will need further blood pressure management treatment by his nephrologist who follows for this.  Patient is not having any hypertensive urgency or crisis at this point.  The patient is advised that he needs return here for any worsening in his condition.  The patient did complain of intermittent chest pressure but states it feels like it is starting in his upper abdomen mostly and is mainly after eating.  The patient states that he has not had any further issues of tingling throughout his entire body. Final Clinical Impressions(s) / ED Diagnoses   Final diagnoses:  None    ED Discharge Orders    None       Charlestine Night, PA-C 12/26/18 1336    [address], [place], PA-C 12/26/18 1336    Jacalyn Lefevre, MD 12/26/18 1338

## 2018-12-26 NOTE — ED Notes (Addendum)
Assessment performed by provider. Patient states he has chest pain when eating and following eating. No shortness of breath and no abdominal pain. Patient states his body "tingles all over." Patient has taken ranitidine regularly.

## 2018-12-26 NOTE — ED Triage Notes (Signed)
Pt to ED with c/o of his blood pressure being elevated.  Pt st's he took his HTN meds tonight.  Also st's he started having upper chest pressure when he noticed his blood pressure was up

## 2019-01-09 DIAGNOSIS — D519 Vitamin B12 deficiency anemia, unspecified: Secondary | ICD-10-CM | POA: Diagnosis not present

## 2019-01-09 DIAGNOSIS — E785 Hyperlipidemia, unspecified: Secondary | ICD-10-CM | POA: Diagnosis not present

## 2019-01-16 DIAGNOSIS — J302 Other seasonal allergic rhinitis: Secondary | ICD-10-CM | POA: Diagnosis not present

## 2019-01-16 DIAGNOSIS — R5381 Other malaise: Secondary | ICD-10-CM | POA: Diagnosis not present

## 2019-01-16 DIAGNOSIS — R5383 Other fatigue: Secondary | ICD-10-CM | POA: Diagnosis not present

## 2019-02-07 DIAGNOSIS — E1143 Type 2 diabetes mellitus with diabetic autonomic (poly)neuropathy: Secondary | ICD-10-CM | POA: Diagnosis not present

## 2019-02-07 DIAGNOSIS — Z89432 Acquired absence of left foot: Secondary | ICD-10-CM | POA: Diagnosis not present

## 2019-02-07 DIAGNOSIS — M14672 Charcot's joint, left ankle and foot: Secondary | ICD-10-CM | POA: Diagnosis not present

## 2019-02-07 DIAGNOSIS — L84 Corns and callosities: Secondary | ICD-10-CM | POA: Diagnosis not present

## 2019-02-08 DIAGNOSIS — D519 Vitamin B12 deficiency anemia, unspecified: Secondary | ICD-10-CM | POA: Diagnosis not present

## 2019-02-08 DIAGNOSIS — E7800 Pure hypercholesterolemia, unspecified: Secondary | ICD-10-CM | POA: Diagnosis not present

## 2019-02-08 DIAGNOSIS — M109 Gout, unspecified: Secondary | ICD-10-CM | POA: Diagnosis not present

## 2019-02-12 DIAGNOSIS — N2581 Secondary hyperparathyroidism of renal origin: Secondary | ICD-10-CM | POA: Diagnosis not present

## 2019-02-12 DIAGNOSIS — E1122 Type 2 diabetes mellitus with diabetic chronic kidney disease: Secondary | ICD-10-CM | POA: Diagnosis not present

## 2019-02-12 DIAGNOSIS — E1143 Type 2 diabetes mellitus with diabetic autonomic (poly)neuropathy: Secondary | ICD-10-CM | POA: Diagnosis not present

## 2019-02-12 DIAGNOSIS — N183 Chronic kidney disease, stage 3 unspecified: Secondary | ICD-10-CM | POA: Diagnosis not present

## 2019-02-12 DIAGNOSIS — Z89432 Acquired absence of left foot: Secondary | ICD-10-CM | POA: Diagnosis not present

## 2019-02-12 DIAGNOSIS — I129 Hypertensive chronic kidney disease with stage 1 through stage 4 chronic kidney disease, or unspecified chronic kidney disease: Secondary | ICD-10-CM | POA: Diagnosis not present

## 2019-02-12 DIAGNOSIS — E875 Hyperkalemia: Secondary | ICD-10-CM | POA: Diagnosis not present

## 2019-02-12 DIAGNOSIS — D631 Anemia in chronic kidney disease: Secondary | ICD-10-CM | POA: Diagnosis not present

## 2019-02-12 DIAGNOSIS — N189 Chronic kidney disease, unspecified: Secondary | ICD-10-CM | POA: Diagnosis not present

## 2019-02-15 DIAGNOSIS — E1165 Type 2 diabetes mellitus with hyperglycemia: Secondary | ICD-10-CM | POA: Diagnosis not present

## 2019-02-19 DIAGNOSIS — Z89411 Acquired absence of right great toe: Secondary | ICD-10-CM | POA: Diagnosis not present

## 2019-02-19 DIAGNOSIS — M14671 Charcot's joint, right ankle and foot: Secondary | ICD-10-CM | POA: Diagnosis not present

## 2019-02-19 DIAGNOSIS — E11610 Type 2 diabetes mellitus with diabetic neuropathic arthropathy: Secondary | ICD-10-CM | POA: Diagnosis not present

## 2019-02-19 DIAGNOSIS — Z89432 Acquired absence of left foot: Secondary | ICD-10-CM | POA: Diagnosis not present

## 2019-03-11 DIAGNOSIS — E7800 Pure hypercholesterolemia, unspecified: Secondary | ICD-10-CM | POA: Diagnosis not present

## 2019-03-11 DIAGNOSIS — M109 Gout, unspecified: Secondary | ICD-10-CM | POA: Diagnosis not present

## 2019-03-11 DIAGNOSIS — D519 Vitamin B12 deficiency anemia, unspecified: Secondary | ICD-10-CM | POA: Diagnosis not present

## 2019-04-09 DIAGNOSIS — Z23 Encounter for immunization: Secondary | ICD-10-CM | POA: Diagnosis not present

## 2019-04-09 DIAGNOSIS — D510 Vitamin B12 deficiency anemia due to intrinsic factor deficiency: Secondary | ICD-10-CM | POA: Diagnosis not present

## 2019-04-09 DIAGNOSIS — E1165 Type 2 diabetes mellitus with hyperglycemia: Secondary | ICD-10-CM | POA: Diagnosis not present

## 2019-04-09 DIAGNOSIS — R635 Abnormal weight gain: Secondary | ICD-10-CM | POA: Diagnosis not present

## 2019-04-09 DIAGNOSIS — Z6841 Body Mass Index (BMI) 40.0 and over, adult: Secondary | ICD-10-CM | POA: Diagnosis not present

## 2019-04-09 DIAGNOSIS — E7800 Pure hypercholesterolemia, unspecified: Secondary | ICD-10-CM | POA: Diagnosis not present

## 2019-04-11 DIAGNOSIS — D519 Vitamin B12 deficiency anemia, unspecified: Secondary | ICD-10-CM | POA: Diagnosis not present

## 2019-04-11 DIAGNOSIS — E7800 Pure hypercholesterolemia, unspecified: Secondary | ICD-10-CM | POA: Diagnosis not present

## 2019-04-11 DIAGNOSIS — E1165 Type 2 diabetes mellitus with hyperglycemia: Secondary | ICD-10-CM | POA: Diagnosis not present

## 2019-04-17 DIAGNOSIS — E1143 Type 2 diabetes mellitus with diabetic autonomic (poly)neuropathy: Secondary | ICD-10-CM | POA: Diagnosis not present

## 2019-04-17 DIAGNOSIS — M14672 Charcot's joint, left ankle and foot: Secondary | ICD-10-CM | POA: Diagnosis not present

## 2019-04-17 DIAGNOSIS — L84 Corns and callosities: Secondary | ICD-10-CM | POA: Diagnosis not present

## 2019-04-17 DIAGNOSIS — Z89432 Acquired absence of left foot: Secondary | ICD-10-CM | POA: Diagnosis not present

## 2019-05-11 DIAGNOSIS — M109 Gout, unspecified: Secondary | ICD-10-CM | POA: Diagnosis not present

## 2019-05-11 DIAGNOSIS — D519 Vitamin B12 deficiency anemia, unspecified: Secondary | ICD-10-CM | POA: Diagnosis not present

## 2019-05-11 DIAGNOSIS — E7800 Pure hypercholesterolemia, unspecified: Secondary | ICD-10-CM | POA: Diagnosis not present

## 2019-05-28 DIAGNOSIS — E1165 Type 2 diabetes mellitus with hyperglycemia: Secondary | ICD-10-CM | POA: Diagnosis not present

## 2019-06-09 DIAGNOSIS — E1165 Type 2 diabetes mellitus with hyperglycemia: Secondary | ICD-10-CM | POA: Diagnosis not present

## 2019-06-09 DIAGNOSIS — E7800 Pure hypercholesterolemia, unspecified: Secondary | ICD-10-CM | POA: Diagnosis not present

## 2019-06-21 DIAGNOSIS — Z89432 Acquired absence of left foot: Secondary | ICD-10-CM | POA: Diagnosis not present

## 2019-06-21 DIAGNOSIS — E1122 Type 2 diabetes mellitus with diabetic chronic kidney disease: Secondary | ICD-10-CM | POA: Diagnosis not present

## 2019-06-21 DIAGNOSIS — D631 Anemia in chronic kidney disease: Secondary | ICD-10-CM | POA: Diagnosis not present

## 2019-06-21 DIAGNOSIS — E875 Hyperkalemia: Secondary | ICD-10-CM | POA: Diagnosis not present

## 2019-06-21 DIAGNOSIS — E1143 Type 2 diabetes mellitus with diabetic autonomic (poly)neuropathy: Secondary | ICD-10-CM | POA: Diagnosis not present

## 2019-06-21 DIAGNOSIS — N183 Chronic kidney disease, stage 3 unspecified: Secondary | ICD-10-CM | POA: Diagnosis not present

## 2019-06-21 DIAGNOSIS — N2581 Secondary hyperparathyroidism of renal origin: Secondary | ICD-10-CM | POA: Diagnosis not present

## 2019-06-21 DIAGNOSIS — I129 Hypertensive chronic kidney disease with stage 1 through stage 4 chronic kidney disease, or unspecified chronic kidney disease: Secondary | ICD-10-CM | POA: Diagnosis not present

## 2019-07-10 DIAGNOSIS — D519 Vitamin B12 deficiency anemia, unspecified: Secondary | ICD-10-CM | POA: Diagnosis not present

## 2019-07-10 DIAGNOSIS — M109 Gout, unspecified: Secondary | ICD-10-CM | POA: Diagnosis not present

## 2019-07-10 DIAGNOSIS — E1165 Type 2 diabetes mellitus with hyperglycemia: Secondary | ICD-10-CM | POA: Diagnosis not present

## 2019-07-15 DIAGNOSIS — E1143 Type 2 diabetes mellitus with diabetic autonomic (poly)neuropathy: Secondary | ICD-10-CM | POA: Diagnosis not present

## 2019-07-15 DIAGNOSIS — L84 Corns and callosities: Secondary | ICD-10-CM | POA: Diagnosis not present

## 2019-07-15 DIAGNOSIS — E11621 Type 2 diabetes mellitus with foot ulcer: Secondary | ICD-10-CM | POA: Diagnosis not present

## 2019-07-15 DIAGNOSIS — L97421 Non-pressure chronic ulcer of left heel and midfoot limited to breakdown of skin: Secondary | ICD-10-CM | POA: Diagnosis not present

## 2019-07-15 DIAGNOSIS — Z89432 Acquired absence of left foot: Secondary | ICD-10-CM | POA: Diagnosis not present

## 2019-07-25 DIAGNOSIS — E11621 Type 2 diabetes mellitus with foot ulcer: Secondary | ICD-10-CM | POA: Diagnosis not present

## 2019-07-25 DIAGNOSIS — L97501 Non-pressure chronic ulcer of other part of unspecified foot limited to breakdown of skin: Secondary | ICD-10-CM | POA: Diagnosis not present

## 2019-08-08 DIAGNOSIS — L97422 Non-pressure chronic ulcer of left heel and midfoot with fat layer exposed: Secondary | ICD-10-CM | POA: Diagnosis not present

## 2019-08-08 DIAGNOSIS — L97501 Non-pressure chronic ulcer of other part of unspecified foot limited to breakdown of skin: Secondary | ICD-10-CM | POA: Diagnosis not present

## 2019-08-08 DIAGNOSIS — E1143 Type 2 diabetes mellitus with diabetic autonomic (poly)neuropathy: Secondary | ICD-10-CM | POA: Diagnosis not present

## 2019-08-08 DIAGNOSIS — M14672 Charcot's joint, left ankle and foot: Secondary | ICD-10-CM | POA: Diagnosis not present

## 2019-08-08 DIAGNOSIS — Z89439 Acquired absence of unspecified foot: Secondary | ICD-10-CM | POA: Diagnosis not present

## 2019-08-08 DIAGNOSIS — E08621 Diabetes mellitus due to underlying condition with foot ulcer: Secondary | ICD-10-CM | POA: Diagnosis not present

## 2019-08-08 DIAGNOSIS — E11621 Type 2 diabetes mellitus with foot ulcer: Secondary | ICD-10-CM | POA: Diagnosis not present

## 2019-08-09 DIAGNOSIS — E782 Mixed hyperlipidemia: Secondary | ICD-10-CM | POA: Diagnosis not present

## 2019-08-09 DIAGNOSIS — I10 Essential (primary) hypertension: Secondary | ICD-10-CM | POA: Diagnosis not present

## 2019-08-09 DIAGNOSIS — E1165 Type 2 diabetes mellitus with hyperglycemia: Secondary | ICD-10-CM | POA: Diagnosis not present

## 2019-08-11 DIAGNOSIS — E11621 Type 2 diabetes mellitus with foot ulcer: Secondary | ICD-10-CM

## 2019-08-11 DIAGNOSIS — L97429 Non-pressure chronic ulcer of left heel and midfoot with unspecified severity: Secondary | ICD-10-CM | POA: Insufficient documentation

## 2019-08-11 HISTORY — DX: Type 2 diabetes mellitus with foot ulcer: E11.621

## 2019-08-11 HISTORY — DX: Non-pressure chronic ulcer of left heel and midfoot with unspecified severity: L97.429

## 2019-08-16 DIAGNOSIS — Z89422 Acquired absence of other left toe(s): Secondary | ICD-10-CM | POA: Diagnosis not present

## 2019-08-16 DIAGNOSIS — L97521 Non-pressure chronic ulcer of other part of left foot limited to breakdown of skin: Secondary | ICD-10-CM | POA: Diagnosis not present

## 2019-08-16 DIAGNOSIS — I739 Peripheral vascular disease, unspecified: Secondary | ICD-10-CM | POA: Diagnosis not present

## 2019-08-16 DIAGNOSIS — I10 Essential (primary) hypertension: Secondary | ICD-10-CM | POA: Diagnosis not present

## 2019-08-16 DIAGNOSIS — E11621 Type 2 diabetes mellitus with foot ulcer: Secondary | ICD-10-CM | POA: Diagnosis not present

## 2019-08-16 DIAGNOSIS — L97422 Non-pressure chronic ulcer of left heel and midfoot with fat layer exposed: Secondary | ICD-10-CM | POA: Diagnosis not present

## 2019-08-16 DIAGNOSIS — K219 Gastro-esophageal reflux disease without esophagitis: Secondary | ICD-10-CM | POA: Diagnosis not present

## 2019-08-16 DIAGNOSIS — Z794 Long term (current) use of insulin: Secondary | ICD-10-CM | POA: Diagnosis not present

## 2019-09-03 DIAGNOSIS — M14672 Charcot's joint, left ankle and foot: Secondary | ICD-10-CM | POA: Diagnosis not present

## 2019-09-03 DIAGNOSIS — Z89439 Acquired absence of unspecified foot: Secondary | ICD-10-CM | POA: Diagnosis not present

## 2019-09-03 DIAGNOSIS — E11621 Type 2 diabetes mellitus with foot ulcer: Secondary | ICD-10-CM | POA: Diagnosis not present

## 2019-09-03 DIAGNOSIS — L97501 Non-pressure chronic ulcer of other part of unspecified foot limited to breakdown of skin: Secondary | ICD-10-CM | POA: Diagnosis not present

## 2019-09-09 DIAGNOSIS — E1122 Type 2 diabetes mellitus with diabetic chronic kidney disease: Secondary | ICD-10-CM | POA: Diagnosis not present

## 2019-09-09 DIAGNOSIS — K219 Gastro-esophageal reflux disease without esophagitis: Secondary | ICD-10-CM | POA: Diagnosis not present

## 2019-09-09 DIAGNOSIS — N189 Chronic kidney disease, unspecified: Secondary | ICD-10-CM | POA: Diagnosis not present

## 2019-09-09 DIAGNOSIS — E7800 Pure hypercholesterolemia, unspecified: Secondary | ICD-10-CM | POA: Diagnosis not present

## 2019-09-13 DIAGNOSIS — E11621 Type 2 diabetes mellitus with foot ulcer: Secondary | ICD-10-CM | POA: Diagnosis not present

## 2019-09-13 DIAGNOSIS — L97421 Non-pressure chronic ulcer of left heel and midfoot limited to breakdown of skin: Secondary | ICD-10-CM | POA: Diagnosis not present

## 2019-09-16 DIAGNOSIS — E1165 Type 2 diabetes mellitus with hyperglycemia: Secondary | ICD-10-CM | POA: Diagnosis not present

## 2019-09-18 DIAGNOSIS — L97422 Non-pressure chronic ulcer of left heel and midfoot with fat layer exposed: Secondary | ICD-10-CM | POA: Diagnosis not present

## 2019-09-18 DIAGNOSIS — E11621 Type 2 diabetes mellitus with foot ulcer: Secondary | ICD-10-CM | POA: Diagnosis not present

## 2019-09-26 DIAGNOSIS — L97422 Non-pressure chronic ulcer of left heel and midfoot with fat layer exposed: Secondary | ICD-10-CM | POA: Diagnosis not present

## 2019-09-26 DIAGNOSIS — E11621 Type 2 diabetes mellitus with foot ulcer: Secondary | ICD-10-CM | POA: Diagnosis not present

## 2019-10-02 DIAGNOSIS — Z89439 Acquired absence of unspecified foot: Secondary | ICD-10-CM | POA: Diagnosis not present

## 2019-10-02 DIAGNOSIS — E11621 Type 2 diabetes mellitus with foot ulcer: Secondary | ICD-10-CM | POA: Diagnosis not present

## 2019-10-02 DIAGNOSIS — M14672 Charcot's joint, left ankle and foot: Secondary | ICD-10-CM | POA: Diagnosis not present

## 2019-10-02 DIAGNOSIS — L97422 Non-pressure chronic ulcer of left heel and midfoot with fat layer exposed: Secondary | ICD-10-CM | POA: Diagnosis not present

## 2019-10-09 DIAGNOSIS — K219 Gastro-esophageal reflux disease without esophagitis: Secondary | ICD-10-CM | POA: Diagnosis not present

## 2019-10-09 DIAGNOSIS — E7800 Pure hypercholesterolemia, unspecified: Secondary | ICD-10-CM | POA: Diagnosis not present

## 2019-10-09 DIAGNOSIS — E1122 Type 2 diabetes mellitus with diabetic chronic kidney disease: Secondary | ICD-10-CM | POA: Diagnosis not present

## 2019-10-09 DIAGNOSIS — N189 Chronic kidney disease, unspecified: Secondary | ICD-10-CM | POA: Diagnosis not present

## 2019-10-17 DIAGNOSIS — Z89432 Acquired absence of left foot: Secondary | ICD-10-CM | POA: Diagnosis not present

## 2019-10-17 DIAGNOSIS — L97501 Non-pressure chronic ulcer of other part of unspecified foot limited to breakdown of skin: Secondary | ICD-10-CM | POA: Diagnosis not present

## 2019-10-17 DIAGNOSIS — E11621 Type 2 diabetes mellitus with foot ulcer: Secondary | ICD-10-CM | POA: Diagnosis not present

## 2019-10-29 DIAGNOSIS — E875 Hyperkalemia: Secondary | ICD-10-CM | POA: Diagnosis not present

## 2019-10-29 DIAGNOSIS — N183 Chronic kidney disease, stage 3 unspecified: Secondary | ICD-10-CM | POA: Diagnosis not present

## 2019-10-29 DIAGNOSIS — E1143 Type 2 diabetes mellitus with diabetic autonomic (poly)neuropathy: Secondary | ICD-10-CM | POA: Diagnosis not present

## 2019-10-29 DIAGNOSIS — N2581 Secondary hyperparathyroidism of renal origin: Secondary | ICD-10-CM | POA: Diagnosis not present

## 2019-10-29 DIAGNOSIS — D631 Anemia in chronic kidney disease: Secondary | ICD-10-CM | POA: Diagnosis not present

## 2019-10-29 DIAGNOSIS — E1122 Type 2 diabetes mellitus with diabetic chronic kidney disease: Secondary | ICD-10-CM | POA: Diagnosis not present

## 2019-10-29 DIAGNOSIS — I129 Hypertensive chronic kidney disease with stage 1 through stage 4 chronic kidney disease, or unspecified chronic kidney disease: Secondary | ICD-10-CM | POA: Diagnosis not present

## 2019-10-29 DIAGNOSIS — Z89432 Acquired absence of left foot: Secondary | ICD-10-CM | POA: Diagnosis not present

## 2019-10-30 DIAGNOSIS — M14672 Charcot's joint, left ankle and foot: Secondary | ICD-10-CM | POA: Diagnosis not present

## 2019-10-30 DIAGNOSIS — E11621 Type 2 diabetes mellitus with foot ulcer: Secondary | ICD-10-CM | POA: Diagnosis not present

## 2019-10-30 DIAGNOSIS — L97501 Non-pressure chronic ulcer of other part of unspecified foot limited to breakdown of skin: Secondary | ICD-10-CM | POA: Diagnosis not present

## 2019-11-09 DIAGNOSIS — K219 Gastro-esophageal reflux disease without esophagitis: Secondary | ICD-10-CM | POA: Diagnosis not present

## 2019-11-09 DIAGNOSIS — N189 Chronic kidney disease, unspecified: Secondary | ICD-10-CM | POA: Diagnosis not present

## 2019-11-09 DIAGNOSIS — E7800 Pure hypercholesterolemia, unspecified: Secondary | ICD-10-CM | POA: Diagnosis not present

## 2019-11-09 DIAGNOSIS — E1122 Type 2 diabetes mellitus with diabetic chronic kidney disease: Secondary | ICD-10-CM | POA: Diagnosis not present

## 2019-11-14 DIAGNOSIS — Z125 Encounter for screening for malignant neoplasm of prostate: Secondary | ICD-10-CM | POA: Diagnosis not present

## 2019-11-14 DIAGNOSIS — L97501 Non-pressure chronic ulcer of other part of unspecified foot limited to breakdown of skin: Secondary | ICD-10-CM | POA: Diagnosis not present

## 2019-11-14 DIAGNOSIS — Z6837 Body mass index (BMI) 37.0-37.9, adult: Secondary | ICD-10-CM | POA: Diagnosis not present

## 2019-11-14 DIAGNOSIS — M14672 Charcot's joint, left ankle and foot: Secondary | ICD-10-CM | POA: Diagnosis not present

## 2019-11-14 DIAGNOSIS — Z89432 Acquired absence of left foot: Secondary | ICD-10-CM | POA: Diagnosis not present

## 2019-11-14 DIAGNOSIS — E1143 Type 2 diabetes mellitus with diabetic autonomic (poly)neuropathy: Secondary | ICD-10-CM | POA: Diagnosis not present

## 2019-11-14 DIAGNOSIS — E1165 Type 2 diabetes mellitus with hyperglycemia: Secondary | ICD-10-CM | POA: Diagnosis not present

## 2019-11-14 DIAGNOSIS — E7800 Pure hypercholesterolemia, unspecified: Secondary | ICD-10-CM | POA: Diagnosis not present

## 2019-11-14 DIAGNOSIS — N189 Chronic kidney disease, unspecified: Secondary | ICD-10-CM | POA: Diagnosis not present

## 2019-11-14 DIAGNOSIS — E08621 Diabetes mellitus due to underlying condition with foot ulcer: Secondary | ICD-10-CM | POA: Diagnosis not present

## 2019-12-10 DIAGNOSIS — N189 Chronic kidney disease, unspecified: Secondary | ICD-10-CM | POA: Diagnosis not present

## 2019-12-10 DIAGNOSIS — E1122 Type 2 diabetes mellitus with diabetic chronic kidney disease: Secondary | ICD-10-CM | POA: Diagnosis not present

## 2019-12-10 DIAGNOSIS — E7800 Pure hypercholesterolemia, unspecified: Secondary | ICD-10-CM | POA: Diagnosis not present

## 2019-12-10 DIAGNOSIS — K219 Gastro-esophageal reflux disease without esophagitis: Secondary | ICD-10-CM | POA: Diagnosis not present

## 2019-12-18 DIAGNOSIS — Z89432 Acquired absence of left foot: Secondary | ICD-10-CM | POA: Diagnosis not present

## 2019-12-18 DIAGNOSIS — E1143 Type 2 diabetes mellitus with diabetic autonomic (poly)neuropathy: Secondary | ICD-10-CM | POA: Diagnosis not present

## 2019-12-18 DIAGNOSIS — E08621 Diabetes mellitus due to underlying condition with foot ulcer: Secondary | ICD-10-CM | POA: Diagnosis not present

## 2019-12-18 DIAGNOSIS — L97422 Non-pressure chronic ulcer of left heel and midfoot with fat layer exposed: Secondary | ICD-10-CM | POA: Diagnosis not present

## 2019-12-18 DIAGNOSIS — M14672 Charcot's joint, left ankle and foot: Secondary | ICD-10-CM | POA: Diagnosis not present

## 2020-01-09 DIAGNOSIS — K219 Gastro-esophageal reflux disease without esophagitis: Secondary | ICD-10-CM | POA: Diagnosis not present

## 2020-01-09 DIAGNOSIS — N189 Chronic kidney disease, unspecified: Secondary | ICD-10-CM | POA: Diagnosis not present

## 2020-01-09 DIAGNOSIS — E7800 Pure hypercholesterolemia, unspecified: Secondary | ICD-10-CM | POA: Diagnosis not present

## 2020-01-09 DIAGNOSIS — E1122 Type 2 diabetes mellitus with diabetic chronic kidney disease: Secondary | ICD-10-CM | POA: Diagnosis not present

## 2020-02-04 DIAGNOSIS — E11621 Type 2 diabetes mellitus with foot ulcer: Secondary | ICD-10-CM | POA: Diagnosis not present

## 2020-02-04 DIAGNOSIS — Z89432 Acquired absence of left foot: Secondary | ICD-10-CM | POA: Diagnosis not present

## 2020-02-04 DIAGNOSIS — E1143 Type 2 diabetes mellitus with diabetic autonomic (poly)neuropathy: Secondary | ICD-10-CM | POA: Diagnosis not present

## 2020-02-04 DIAGNOSIS — M14672 Charcot's joint, left ankle and foot: Secondary | ICD-10-CM | POA: Diagnosis not present

## 2020-02-04 DIAGNOSIS — L97429 Non-pressure chronic ulcer of left heel and midfoot with unspecified severity: Secondary | ICD-10-CM | POA: Diagnosis not present

## 2020-02-08 DIAGNOSIS — E1122 Type 2 diabetes mellitus with diabetic chronic kidney disease: Secondary | ICD-10-CM | POA: Diagnosis not present

## 2020-02-08 DIAGNOSIS — N189 Chronic kidney disease, unspecified: Secondary | ICD-10-CM | POA: Diagnosis not present

## 2020-02-08 DIAGNOSIS — K219 Gastro-esophageal reflux disease without esophagitis: Secondary | ICD-10-CM | POA: Diagnosis not present

## 2020-02-08 DIAGNOSIS — E7800 Pure hypercholesterolemia, unspecified: Secondary | ICD-10-CM | POA: Diagnosis not present

## 2020-02-20 DIAGNOSIS — L84 Corns and callosities: Secondary | ICD-10-CM | POA: Diagnosis not present

## 2020-02-20 DIAGNOSIS — M14672 Charcot's joint, left ankle and foot: Secondary | ICD-10-CM | POA: Diagnosis not present

## 2020-02-20 DIAGNOSIS — E1143 Type 2 diabetes mellitus with diabetic autonomic (poly)neuropathy: Secondary | ICD-10-CM | POA: Diagnosis not present

## 2020-02-20 DIAGNOSIS — Z89439 Acquired absence of unspecified foot: Secondary | ICD-10-CM | POA: Diagnosis not present

## 2020-03-10 DIAGNOSIS — E1122 Type 2 diabetes mellitus with diabetic chronic kidney disease: Secondary | ICD-10-CM | POA: Diagnosis not present

## 2020-03-10 DIAGNOSIS — K219 Gastro-esophageal reflux disease without esophagitis: Secondary | ICD-10-CM | POA: Diagnosis not present

## 2020-03-10 DIAGNOSIS — N189 Chronic kidney disease, unspecified: Secondary | ICD-10-CM | POA: Diagnosis not present

## 2020-03-10 DIAGNOSIS — E7800 Pure hypercholesterolemia, unspecified: Secondary | ICD-10-CM | POA: Diagnosis not present

## 2020-03-16 DIAGNOSIS — Z6838 Body mass index (BMI) 38.0-38.9, adult: Secondary | ICD-10-CM | POA: Diagnosis not present

## 2020-03-16 DIAGNOSIS — M1460 Charcot's joint, unspecified site: Secondary | ICD-10-CM | POA: Diagnosis not present

## 2020-03-16 DIAGNOSIS — Z9119 Patient's noncompliance with other medical treatment and regimen: Secondary | ICD-10-CM | POA: Diagnosis not present

## 2020-03-16 DIAGNOSIS — N189 Chronic kidney disease, unspecified: Secondary | ICD-10-CM | POA: Diagnosis not present

## 2020-03-16 DIAGNOSIS — E1165 Type 2 diabetes mellitus with hyperglycemia: Secondary | ICD-10-CM | POA: Diagnosis not present

## 2020-03-19 DIAGNOSIS — E1143 Type 2 diabetes mellitus with diabetic autonomic (poly)neuropathy: Secondary | ICD-10-CM | POA: Diagnosis not present

## 2020-03-19 DIAGNOSIS — L84 Corns and callosities: Secondary | ICD-10-CM | POA: Diagnosis not present

## 2020-03-19 DIAGNOSIS — M14672 Charcot's joint, left ankle and foot: Secondary | ICD-10-CM | POA: Diagnosis not present

## 2020-04-09 DIAGNOSIS — E1122 Type 2 diabetes mellitus with diabetic chronic kidney disease: Secondary | ICD-10-CM | POA: Diagnosis not present

## 2020-04-09 DIAGNOSIS — K219 Gastro-esophageal reflux disease without esophagitis: Secondary | ICD-10-CM | POA: Diagnosis not present

## 2020-04-09 DIAGNOSIS — N189 Chronic kidney disease, unspecified: Secondary | ICD-10-CM | POA: Diagnosis not present

## 2020-04-09 DIAGNOSIS — E7800 Pure hypercholesterolemia, unspecified: Secondary | ICD-10-CM | POA: Diagnosis not present

## 2020-04-16 DIAGNOSIS — L84 Corns and callosities: Secondary | ICD-10-CM | POA: Diagnosis not present

## 2020-04-16 DIAGNOSIS — Z89439 Acquired absence of unspecified foot: Secondary | ICD-10-CM | POA: Diagnosis not present

## 2020-04-16 DIAGNOSIS — E1143 Type 2 diabetes mellitus with diabetic autonomic (poly)neuropathy: Secondary | ICD-10-CM | POA: Diagnosis not present

## 2020-04-28 DIAGNOSIS — E1143 Type 2 diabetes mellitus with diabetic autonomic (poly)neuropathy: Secondary | ICD-10-CM | POA: Diagnosis not present

## 2020-04-28 DIAGNOSIS — N183 Chronic kidney disease, stage 3 unspecified: Secondary | ICD-10-CM | POA: Diagnosis not present

## 2020-04-28 DIAGNOSIS — Z89432 Acquired absence of left foot: Secondary | ICD-10-CM | POA: Diagnosis not present

## 2020-04-28 DIAGNOSIS — I129 Hypertensive chronic kidney disease with stage 1 through stage 4 chronic kidney disease, or unspecified chronic kidney disease: Secondary | ICD-10-CM | POA: Diagnosis not present

## 2020-04-28 DIAGNOSIS — N2581 Secondary hyperparathyroidism of renal origin: Secondary | ICD-10-CM | POA: Diagnosis not present

## 2020-04-28 DIAGNOSIS — E1122 Type 2 diabetes mellitus with diabetic chronic kidney disease: Secondary | ICD-10-CM | POA: Diagnosis not present

## 2020-04-28 DIAGNOSIS — E875 Hyperkalemia: Secondary | ICD-10-CM | POA: Diagnosis not present

## 2020-04-28 DIAGNOSIS — N189 Chronic kidney disease, unspecified: Secondary | ICD-10-CM | POA: Diagnosis not present

## 2020-05-10 DIAGNOSIS — E1122 Type 2 diabetes mellitus with diabetic chronic kidney disease: Secondary | ICD-10-CM | POA: Diagnosis not present

## 2020-05-10 DIAGNOSIS — K219 Gastro-esophageal reflux disease without esophagitis: Secondary | ICD-10-CM | POA: Diagnosis not present

## 2020-05-10 DIAGNOSIS — N189 Chronic kidney disease, unspecified: Secondary | ICD-10-CM | POA: Diagnosis not present

## 2020-05-10 DIAGNOSIS — E7800 Pure hypercholesterolemia, unspecified: Secondary | ICD-10-CM | POA: Diagnosis not present

## 2020-05-21 DIAGNOSIS — R195 Other fecal abnormalities: Secondary | ICD-10-CM | POA: Diagnosis not present

## 2020-05-21 DIAGNOSIS — Z6838 Body mass index (BMI) 38.0-38.9, adult: Secondary | ICD-10-CM | POA: Diagnosis not present

## 2020-05-21 DIAGNOSIS — M1460 Charcot's joint, unspecified site: Secondary | ICD-10-CM | POA: Diagnosis not present

## 2020-05-21 DIAGNOSIS — E7800 Pure hypercholesterolemia, unspecified: Secondary | ICD-10-CM | POA: Diagnosis not present

## 2020-05-21 DIAGNOSIS — M109 Gout, unspecified: Secondary | ICD-10-CM | POA: Diagnosis not present

## 2020-05-21 DIAGNOSIS — E1165 Type 2 diabetes mellitus with hyperglycemia: Secondary | ICD-10-CM | POA: Diagnosis not present

## 2020-05-21 DIAGNOSIS — N189 Chronic kidney disease, unspecified: Secondary | ICD-10-CM | POA: Diagnosis not present

## 2020-05-26 DIAGNOSIS — Z79899 Other long term (current) drug therapy: Secondary | ICD-10-CM | POA: Diagnosis not present

## 2020-05-28 DIAGNOSIS — Z89432 Acquired absence of left foot: Secondary | ICD-10-CM | POA: Diagnosis not present

## 2020-05-28 DIAGNOSIS — L84 Corns and callosities: Secondary | ICD-10-CM | POA: Diagnosis not present

## 2020-05-28 DIAGNOSIS — E1143 Type 2 diabetes mellitus with diabetic autonomic (poly)neuropathy: Secondary | ICD-10-CM | POA: Diagnosis not present

## 2020-05-28 DIAGNOSIS — M14672 Charcot's joint, left ankle and foot: Secondary | ICD-10-CM | POA: Diagnosis not present

## 2020-06-08 DIAGNOSIS — E1122 Type 2 diabetes mellitus with diabetic chronic kidney disease: Secondary | ICD-10-CM | POA: Diagnosis not present

## 2020-06-08 DIAGNOSIS — K219 Gastro-esophageal reflux disease without esophagitis: Secondary | ICD-10-CM | POA: Diagnosis not present

## 2020-06-08 DIAGNOSIS — N189 Chronic kidney disease, unspecified: Secondary | ICD-10-CM | POA: Diagnosis not present

## 2020-06-08 DIAGNOSIS — E7800 Pure hypercholesterolemia, unspecified: Secondary | ICD-10-CM | POA: Diagnosis not present

## 2020-06-16 DIAGNOSIS — E1143 Type 2 diabetes mellitus with diabetic autonomic (poly)neuropathy: Secondary | ICD-10-CM | POA: Diagnosis not present

## 2020-06-16 DIAGNOSIS — Z89432 Acquired absence of left foot: Secondary | ICD-10-CM | POA: Diagnosis not present

## 2020-06-16 DIAGNOSIS — E1122 Type 2 diabetes mellitus with diabetic chronic kidney disease: Secondary | ICD-10-CM | POA: Diagnosis not present

## 2020-06-16 DIAGNOSIS — E875 Hyperkalemia: Secondary | ICD-10-CM | POA: Diagnosis not present

## 2020-06-16 DIAGNOSIS — N183 Chronic kidney disease, stage 3 unspecified: Secondary | ICD-10-CM | POA: Diagnosis not present

## 2020-06-16 DIAGNOSIS — N2581 Secondary hyperparathyroidism of renal origin: Secondary | ICD-10-CM | POA: Diagnosis not present

## 2020-06-16 DIAGNOSIS — I129 Hypertensive chronic kidney disease with stage 1 through stage 4 chronic kidney disease, or unspecified chronic kidney disease: Secondary | ICD-10-CM | POA: Diagnosis not present

## 2020-06-22 DIAGNOSIS — M14672 Charcot's joint, left ankle and foot: Secondary | ICD-10-CM | POA: Diagnosis not present

## 2020-06-22 DIAGNOSIS — E1143 Type 2 diabetes mellitus with diabetic autonomic (poly)neuropathy: Secondary | ICD-10-CM | POA: Diagnosis not present

## 2020-07-08 DIAGNOSIS — E1122 Type 2 diabetes mellitus with diabetic chronic kidney disease: Secondary | ICD-10-CM | POA: Diagnosis not present

## 2020-07-08 DIAGNOSIS — N189 Chronic kidney disease, unspecified: Secondary | ICD-10-CM | POA: Diagnosis not present

## 2020-07-08 DIAGNOSIS — E7800 Pure hypercholesterolemia, unspecified: Secondary | ICD-10-CM | POA: Diagnosis not present

## 2020-07-08 DIAGNOSIS — K219 Gastro-esophageal reflux disease without esophagitis: Secondary | ICD-10-CM | POA: Diagnosis not present

## 2020-07-09 DIAGNOSIS — Z89432 Acquired absence of left foot: Secondary | ICD-10-CM | POA: Diagnosis not present

## 2020-07-09 DIAGNOSIS — M14672 Charcot's joint, left ankle and foot: Secondary | ICD-10-CM | POA: Diagnosis not present

## 2020-07-09 DIAGNOSIS — L84 Corns and callosities: Secondary | ICD-10-CM | POA: Diagnosis not present

## 2020-07-09 DIAGNOSIS — E1143 Type 2 diabetes mellitus with diabetic autonomic (poly)neuropathy: Secondary | ICD-10-CM | POA: Diagnosis not present

## 2020-07-15 DIAGNOSIS — K219 Gastro-esophageal reflux disease without esophagitis: Secondary | ICD-10-CM | POA: Diagnosis not present

## 2020-07-15 DIAGNOSIS — E119 Type 2 diabetes mellitus without complications: Secondary | ICD-10-CM | POA: Diagnosis not present

## 2020-07-31 DIAGNOSIS — Z794 Long term (current) use of insulin: Secondary | ICD-10-CM | POA: Diagnosis not present

## 2020-07-31 DIAGNOSIS — E1122 Type 2 diabetes mellitus with diabetic chronic kidney disease: Secondary | ICD-10-CM | POA: Diagnosis not present

## 2020-07-31 DIAGNOSIS — I129 Hypertensive chronic kidney disease with stage 1 through stage 4 chronic kidney disease, or unspecified chronic kidney disease: Secondary | ICD-10-CM | POA: Diagnosis not present

## 2020-07-31 DIAGNOSIS — N189 Chronic kidney disease, unspecified: Secondary | ICD-10-CM | POA: Diagnosis not present

## 2020-07-31 DIAGNOSIS — K449 Diaphragmatic hernia without obstruction or gangrene: Secondary | ICD-10-CM | POA: Diagnosis not present

## 2020-07-31 DIAGNOSIS — K219 Gastro-esophageal reflux disease without esophagitis: Secondary | ICD-10-CM | POA: Diagnosis not present

## 2020-07-31 DIAGNOSIS — Z1211 Encounter for screening for malignant neoplasm of colon: Secondary | ICD-10-CM | POA: Diagnosis not present

## 2020-07-31 DIAGNOSIS — Z79899 Other long term (current) drug therapy: Secondary | ICD-10-CM | POA: Diagnosis not present

## 2020-08-08 DIAGNOSIS — E7800 Pure hypercholesterolemia, unspecified: Secondary | ICD-10-CM | POA: Diagnosis not present

## 2020-08-08 DIAGNOSIS — E1122 Type 2 diabetes mellitus with diabetic chronic kidney disease: Secondary | ICD-10-CM | POA: Diagnosis not present

## 2020-08-08 DIAGNOSIS — K219 Gastro-esophageal reflux disease without esophagitis: Secondary | ICD-10-CM | POA: Diagnosis not present

## 2020-08-08 DIAGNOSIS — N189 Chronic kidney disease, unspecified: Secondary | ICD-10-CM | POA: Diagnosis not present

## 2020-08-18 DIAGNOSIS — E1165 Type 2 diabetes mellitus with hyperglycemia: Secondary | ICD-10-CM | POA: Diagnosis not present

## 2020-08-20 DIAGNOSIS — S90822A Blister (nonthermal), left foot, initial encounter: Secondary | ICD-10-CM | POA: Diagnosis not present

## 2020-09-07 DIAGNOSIS — K219 Gastro-esophageal reflux disease without esophagitis: Secondary | ICD-10-CM | POA: Diagnosis not present

## 2020-09-07 DIAGNOSIS — E7800 Pure hypercholesterolemia, unspecified: Secondary | ICD-10-CM | POA: Diagnosis not present

## 2020-09-07 DIAGNOSIS — N189 Chronic kidney disease, unspecified: Secondary | ICD-10-CM | POA: Diagnosis not present

## 2020-09-07 DIAGNOSIS — E1122 Type 2 diabetes mellitus with diabetic chronic kidney disease: Secondary | ICD-10-CM | POA: Diagnosis not present

## 2020-09-09 DIAGNOSIS — E1143 Type 2 diabetes mellitus with diabetic autonomic (poly)neuropathy: Secondary | ICD-10-CM | POA: Diagnosis not present

## 2020-09-09 DIAGNOSIS — L84 Corns and callosities: Secondary | ICD-10-CM | POA: Diagnosis not present

## 2020-09-09 DIAGNOSIS — M14672 Charcot's joint, left ankle and foot: Secondary | ICD-10-CM | POA: Diagnosis not present

## 2020-09-09 DIAGNOSIS — Z89432 Acquired absence of left foot: Secondary | ICD-10-CM | POA: Diagnosis not present

## 2020-09-22 DIAGNOSIS — E1122 Type 2 diabetes mellitus with diabetic chronic kidney disease: Secondary | ICD-10-CM | POA: Diagnosis not present

## 2020-09-22 DIAGNOSIS — E1143 Type 2 diabetes mellitus with diabetic autonomic (poly)neuropathy: Secondary | ICD-10-CM | POA: Diagnosis not present

## 2020-09-22 DIAGNOSIS — Z89432 Acquired absence of left foot: Secondary | ICD-10-CM | POA: Diagnosis not present

## 2020-09-22 DIAGNOSIS — I129 Hypertensive chronic kidney disease with stage 1 through stage 4 chronic kidney disease, or unspecified chronic kidney disease: Secondary | ICD-10-CM | POA: Diagnosis not present

## 2020-09-22 DIAGNOSIS — E875 Hyperkalemia: Secondary | ICD-10-CM | POA: Diagnosis not present

## 2020-09-22 DIAGNOSIS — N2581 Secondary hyperparathyroidism of renal origin: Secondary | ICD-10-CM | POA: Diagnosis not present

## 2020-09-22 DIAGNOSIS — N183 Chronic kidney disease, stage 3 unspecified: Secondary | ICD-10-CM | POA: Diagnosis not present

## 2020-10-08 DIAGNOSIS — K219 Gastro-esophageal reflux disease without esophagitis: Secondary | ICD-10-CM | POA: Diagnosis not present

## 2020-10-08 DIAGNOSIS — E7800 Pure hypercholesterolemia, unspecified: Secondary | ICD-10-CM | POA: Diagnosis not present

## 2020-10-08 DIAGNOSIS — E1122 Type 2 diabetes mellitus with diabetic chronic kidney disease: Secondary | ICD-10-CM | POA: Diagnosis not present

## 2020-10-08 DIAGNOSIS — N189 Chronic kidney disease, unspecified: Secondary | ICD-10-CM | POA: Diagnosis not present

## 2020-10-14 DIAGNOSIS — M14672 Charcot's joint, left ankle and foot: Secondary | ICD-10-CM | POA: Diagnosis not present

## 2020-10-14 DIAGNOSIS — Z89432 Acquired absence of left foot: Secondary | ICD-10-CM | POA: Diagnosis not present

## 2020-10-14 DIAGNOSIS — E1143 Type 2 diabetes mellitus with diabetic autonomic (poly)neuropathy: Secondary | ICD-10-CM | POA: Diagnosis not present

## 2020-10-14 DIAGNOSIS — L84 Corns and callosities: Secondary | ICD-10-CM | POA: Diagnosis not present

## 2020-11-08 DIAGNOSIS — K219 Gastro-esophageal reflux disease without esophagitis: Secondary | ICD-10-CM | POA: Diagnosis not present

## 2020-11-08 DIAGNOSIS — N189 Chronic kidney disease, unspecified: Secondary | ICD-10-CM | POA: Diagnosis not present

## 2020-11-08 DIAGNOSIS — E7800 Pure hypercholesterolemia, unspecified: Secondary | ICD-10-CM | POA: Diagnosis not present

## 2020-11-11 DIAGNOSIS — E1143 Type 2 diabetes mellitus with diabetic autonomic (poly)neuropathy: Secondary | ICD-10-CM | POA: Diagnosis not present

## 2020-11-11 DIAGNOSIS — M14672 Charcot's joint, left ankle and foot: Secondary | ICD-10-CM | POA: Diagnosis not present

## 2020-11-11 DIAGNOSIS — L603 Nail dystrophy: Secondary | ICD-10-CM | POA: Diagnosis not present

## 2020-11-11 DIAGNOSIS — L84 Corns and callosities: Secondary | ICD-10-CM | POA: Diagnosis not present

## 2020-11-11 DIAGNOSIS — L97429 Non-pressure chronic ulcer of left heel and midfoot with unspecified severity: Secondary | ICD-10-CM | POA: Diagnosis not present

## 2020-11-11 DIAGNOSIS — E11621 Type 2 diabetes mellitus with foot ulcer: Secondary | ICD-10-CM | POA: Diagnosis not present

## 2020-11-12 DIAGNOSIS — Z6838 Body mass index (BMI) 38.0-38.9, adult: Secondary | ICD-10-CM | POA: Diagnosis not present

## 2020-11-12 DIAGNOSIS — E7800 Pure hypercholesterolemia, unspecified: Secondary | ICD-10-CM | POA: Diagnosis not present

## 2020-11-12 DIAGNOSIS — E1129 Type 2 diabetes mellitus with other diabetic kidney complication: Secondary | ICD-10-CM | POA: Diagnosis not present

## 2020-11-12 DIAGNOSIS — N189 Chronic kidney disease, unspecified: Secondary | ICD-10-CM | POA: Diagnosis not present

## 2020-11-16 ENCOUNTER — Encounter: Payer: Self-pay | Admitting: *Deleted

## 2020-11-16 ENCOUNTER — Encounter: Payer: Self-pay | Admitting: Cardiology

## 2020-11-19 DIAGNOSIS — D510 Vitamin B12 deficiency anemia due to intrinsic factor deficiency: Secondary | ICD-10-CM | POA: Insufficient documentation

## 2020-11-19 DIAGNOSIS — R269 Unspecified abnormalities of gait and mobility: Secondary | ICD-10-CM | POA: Insufficient documentation

## 2020-11-20 ENCOUNTER — Ambulatory Visit: Payer: HMO | Admitting: Cardiology

## 2020-11-20 ENCOUNTER — Other Ambulatory Visit: Payer: Self-pay

## 2020-11-20 ENCOUNTER — Encounter: Payer: Self-pay | Admitting: Cardiology

## 2020-11-20 VITALS — Ht 69.0 in | Wt 250.4 lb

## 2020-11-20 DIAGNOSIS — E0843 Diabetes mellitus due to underlying condition with diabetic autonomic (poly)neuropathy: Secondary | ICD-10-CM | POA: Diagnosis not present

## 2020-11-20 DIAGNOSIS — R011 Cardiac murmur, unspecified: Secondary | ICD-10-CM

## 2020-11-20 DIAGNOSIS — E782 Mixed hyperlipidemia: Secondary | ICD-10-CM

## 2020-11-20 DIAGNOSIS — N183 Chronic kidney disease, stage 3 unspecified: Secondary | ICD-10-CM

## 2020-11-20 DIAGNOSIS — E0821 Diabetes mellitus due to underlying condition with diabetic nephropathy: Secondary | ICD-10-CM

## 2020-11-20 NOTE — Addendum Note (Signed)
Addended by: Eleonore Chiquito on: 11/20/2020 09:24 AM   Modules accepted: Orders

## 2020-11-20 NOTE — Progress Notes (Signed)
Cardiology Office Note:    Date:  11/20/2020   ID:  [name], DOB Oct 08, 1970, MRN [id number]  PCP:  Noni Saupe, MD  Cardiologist:  Garwin Brothers, MD   Referring MD: Noni Saupe, MD    ASSESSMENT:    1. Stage 3 chronic kidney disease, unspecified whether stage 3a or 3b CKD (HCC)   2. Diabetic nephropathy associated with diabetes mellitus due to underlying condition (HCC)   3. Diabetic autonomic neuropathy associated with diabetes mellitus due to underlying condition (HCC)   4. Mixed dyslipidemia   5. Class 2 severe obesity due to excess calories with serious comorbidity in adult, unspecified BMI (HCC)   6. Cardiac murmur    PLAN:    In order of problems listed above:  Primary prevention stressed with the patient.  Importance of compliance with diet medication stressed any vocalized understanding. Essential hypertension and autonomic neuropathy related to postural changes and blood pressure: In the clinic we assessed this.  Patient had a 30 point drop in blood pressure.  Patient mentions to me that he is completely asymptomatic from this.  He has never felt dizzy lightheaded or syncopal attack or any such issues.  I cautioned the patient about fall precautions.  I discussed this process and diabetes related issue with him at extensive length and he vocalized understanding.  I do not see any reason to intervene at this time.  Unfortunately his blood pressure treatment will result in more orthostatic hypotension and that can be dangerous for him.  At the same time, treatment of orthostatic hypotension which is asymptomatic at this time will result in elevated blood pressure and that is a problem.  So I will leave this without intervention in view of the complexities of the problem and the fact that the patient is completely asymptomatic.  Fall precautions were advised. Diabetes mellitus and mixed dyslipidemia: This is managed by primary care and patient is on statin  therapy. Cardiac murmur: Echocardiogram will be done to assess murmur heard on auscultation. Obesity: Diet was emphasized and weight reduction was stressed and he promises to do better. Will be seen in follow-up appointment on a as needed basis.   Medication Adjustments/Labs and Tests Ordered: Current medicines are reviewed at length with the patient today.  Concerns regarding medicines are outlined above.  No orders of the defined types were placed in this encounter.  No orders of the defined types were placed in this encounter.    History of Present Illness:    [name] is a 50 y.o. male who is being seen today for the evaluation of orthostatic hypotension which is asymptomatic at the request of Noni Saupe, MD. patient is a pleasant 50 year old male.  He has past medical history of diabetes mellitus and diabetic autonomic neuropathy and nephropathy.  He has history of mixed dyslipidemia and obesity.  He has had surgeries on both feet and wears special footwear for the same reason.  He denies any chest pain orthopnea or PND.  He takes care of activities of daily living.  No chest pain orthopnea or PND.  At the time of my evaluation, the patient is alert awake oriented and in no distress.  He is referred because of blood pressure fluctuations secondary to change of posture.  Past Medical History:  Diagnosis Date   Charcot's joint of foot, left 03/14/2014   Chronic GERD 03/14/2014   CKD (chronic kidney disease) stage 3, GFR 30-59 ml/min (HCC)  03/15/2014   DIABETES MELLITUS, TYPE I 08/19/2009   Qualifier: Diagnosis of  By: Huntley Dec, Scott     Diabetic autonomic neuropathy (HCC) 02/26/2017   Diabetic nephropathy (HCC) 07/19/2016   Diabetic ulcer of left midfoot associated with type 2 diabetes mellitus (HCC) 08/11/2019   DM (diabetes mellitus) type II uncontrolled with eye manifestation (HCC) 03/14/2014   Gait disorder    Secondary to Diabetic neuropathy, with neuro   referral denied   Gout 03/14/2014   History of amputation of foot (HCC) 02/26/2017   HYPERTENSION 08/19/2009   Qualifier: Diagnosis of  By: Huntley Dec, Scott     Lower limb pain, central, left 03/14/2014   Mixed dyslipidemia 07/19/2016   Obesity with serious comorbidity 07/19/2016   Peripheral arterial occlusive disease (HCC) 04/22/2011   Formatting of this note might be different from the original. Sees. Dr. Lajoyce Corners. 2007 - two middle toes amputated. 2009 - 1st and 5th toe on right foot amputated. 2010 -Half big toe on left foot. 2012 Remainder of big toe on left foot.   Pernicious anemia    Pre-ulcerative calluses 04/29/2017   UNSPECIFIED OSTEOMYELITIS ANKLE AND FOOT 08/19/2009   Qualifier: Diagnosis of  By: Brynda Rim      Past Surgical History:  Procedure Laterality Date   CHOLECYSTECTOMY     TOE AMPUTATION      Current Medications: Current Meds  Medication Sig   allopurinol (ZYLOPRIM) 100 MG tablet Take 100 mg by mouth daily.   aspirin EC 81 MG tablet Take 81 mg by mouth daily.   Cholecalciferol (VITAMIN D) 125 MCG (5000 UT) CAPS Take 5,000 Units by mouth daily.   DULoxetine (CYMBALTA) 60 MG capsule Take 60 mg by mouth daily.   empagliflozin (JARDIANCE) 25 MG TABS tablet Take 25 mg by mouth daily.   fluticasone (FLONASE) 50 MCG/ACT nasal spray Place 2 sprays into both nostrils daily.   hydrALAZINE (APRESOLINE) 10 MG tablet Take 10 mg by mouth 2 (two) times daily.   insulin lispro (HUMALOG) 100 UNIT/ML injection Inject 0-50 Units into the skin daily. Use as directed per sliding scale. Max 50 units daily   labetalol (NORMODYNE) 200 MG tablet Take 200 mg by mouth 2 (two) times daily.   Multiple Vitamin (MULTIVITAMIN) tablet Take 1 tablet by mouth daily.   OZEMPIC, 1 MG/DOSE, 4 MG/3ML SOPN Inject 1 mg into the skin once a week.   pantoprazole (PROTONIX) 40 MG tablet Take 40 mg by mouth daily.   pioglitazone (ACTOS) 45 MG tablet Take 45 mg by mouth daily.   rosuvastatin  (CRESTOR) 20 MG tablet Take 20 mg by mouth daily.   [name] FLEXTOUCH 200 UNIT/ML FlexTouch Pen Inject 40 Units into the skin at bedtime.   vitamin B-12 (CYANOCOBALAMIN) 1000 MCG tablet Take 1,000 mcg by mouth 2 (two) times daily.     Allergies:   Hydromorphone, Lisinopril, Motrin [ibuprofen], and Victoza [liraglutide]   Social History   Socioeconomic History   Marital status: Single    Spouse name: Not on file   Number of children: Not on file   Years of education: Not on file   Highest education level: Not on file  Occupational History   Not on file  Tobacco Use   Smoking status: Never   Smokeless tobacco: Never  Substance and Sexual Activity   Alcohol use: Never   Drug use: No   Sexual activity: Not on file  Other Topics Concern   Not on file  Social History Narrative  Not on file   Social Determinants of Health   Financial Resource Strain: Not on file  Food Insecurity: Not on file  Transportation Needs: Not on file  Physical Activity: Not on file  Stress: Not on file  Social Connections: Not on file     Family History: The patient's family history includes Diabetes in his brother and mother; Hypertension in his mother; Prostate cancer in his father; Stroke in his maternal grandmother.  ROS:   Please see the history of present illness.    All other systems reviewed and are negative.  EKGs/Labs/Other Studies Reviewed:    The following studies were reviewed today: EKG reveals sinus rhythm and nonspecific ST-T changes   Recent Labs: No results found for requested labs within last 8760 hours.  Recent Lipid Panel    Component Value Date/Time   CHOL  06/06/2010 1635    175        ATP III CLASSIFICATION:  <200     mg/dL   Desirable  537-482  mg/dL   Borderline High  >=707    mg/dL   High          TRIG 867 06/06/2010 1635   HDL 51 06/06/2010 1635   CHOLHDL 3.4 06/06/2010 1635   VLDL 24 06/06/2010 1635   LDLCALC (H) 06/06/2010 1635    100        Total  Cholesterol/HDL:CHD Risk Coronary Heart Disease Risk Table                     Men   Women  1/2 Average Risk   3.4   3.3  Average Risk       5.0   4.4  2 X Average Risk   9.6   7.1  3 X Average Risk  23.4   11.0        Use the calculated Patient Ratio above and the CHD Risk Table to determine the patient's CHD Risk.        ATP III CLASSIFICATION (LDL):  <100     mg/dL   Optimal  544-920  mg/dL   Near or Above                    Optimal  130-159  mg/dL   Borderline  100-712  mg/dL   High  >197     mg/dL   Very High    Physical Exam:    VS:  Ht 5\' 9"  (1.753 m)   Wt 250 lb 6.4 oz (113.6 kg)   BMI 36.98 kg/m     Wt Readings from Last 3 Encounters:  11/20/20 250 lb 6.4 oz (113.6 kg)  11/12/20 251 lb (113.9 kg)  12/26/18 275 lb (124.7 kg)     GEN: Patient is in no acute distress HEENT: Normal NECK: No JVD; No carotid bruits LYMPHATICS: No lymphadenopathy CARDIAC: S1 S2 regular, 2/6 systolic murmur at the apex. RESPIRATORY:  Clear to auscultation without rales, wheezing or rhonchi  ABDOMEN: Soft, non-tender, non-distended MUSCULOSKELETAL:  No edema; No deformity  SKIN: Warm and dry NEUROLOGIC:  Alert and oriented x 3 PSYCHIATRIC:  Normal affect    Signed, 12/28/18, MD  11/20/2020 9:18 AM    [facility]

## 2020-11-20 NOTE — Patient Instructions (Signed)
Medication Instructions:  No medication changes. *If you need a refill on your cardiac medications before your next appointment, please call your pharmacy*   Lab Work: None ordered If you have labs (blood work) drawn today and your tests are completely normal, you will receive your results only by: . MyChart Message (if you have MyChart) OR . A paper copy in the mail If you have any lab test that is abnormal or we need to change your treatment, we will call you to review the results.   Testing/Procedures: Your physician has requested that you have an echocardiogram. Echocardiography is a painless test that uses sound waves to create images of your heart. It provides your doctor with information about the size and shape of your heart and how well your heart's chambers and valves are working. This procedure takes approximately one hour. There are no restrictions for this procedure.     Follow-Up: At [facility], you and your health needs are our priority.  As part of our continuing mission to provide you with exceptional heart care, we have created designated Provider Care Teams.  These Care Teams include your primary Cardiologist (physician) and Advanced Practice Providers (APPs -  Physician Assistants and Nurse Practitioners) who all work together to provide you with the care you need, when you need it.  We recommend signing up for the patient portal called "MyChart".  Sign up information is provided on this After Visit Summary.  MyChart is used to connect with patients for Virtual Visits (Telemedicine).  Patients are able to view lab/test results, encounter notes, upcoming appointments, etc.  Non-urgent messages can be sent to your provider as well.   To learn more about what you can do with MyChart, go to [email or web address].    Your next appointment:   As needed   The format for your next appointment:   In Person  Provider:   Rajan Revankar, MD   Other  Instructions  Echocardiogram An echocardiogram is a test that uses sound waves (ultrasound) to produce images of the heart. Images from an echocardiogram can provide important information about:  Heart size and shape.  The size and thickness and movement of your heart's walls.  Heart muscle function and strength.  Heart valve function or if you have stenosis. Stenosis is when the heart valves are too narrow.  If blood is flowing backward through the heart valves (regurgitation).  A tumor or infectious growth around the heart valves.  Areas of heart muscle that are not working well because of poor blood flow or injury from a heart attack.  Aneurysm detection. An aneurysm is a weak or damaged part of an artery wall. The wall bulges out from the normal force of blood pumping through the body. Tell a health care provider about:  Any allergies you have.  All medicines you are taking, including vitamins, herbs, eye drops, creams, and over-the-counter medicines.  Any blood disorders you have.  Any surgeries you have had.  Any medical conditions you have.  Whether you are pregnant or may be pregnant. What are the risks? Generally, this is a safe test. However, problems may occur, including an allergic reaction to dye (contrast) that may be used during the test. What happens before the test? No specific preparation is needed. You may eat and drink normally. What happens during the test?  You will take off your clothes from the waist up and put on a hospital gown.  Electrodes or electrocardiogram (ECG)patches may be placed on   your chest. The electrodes or patches are then connected to a device that monitors your heart rate and rhythm.  You will lie down on a table for an ultrasound exam. A gel will be applied to your chest to help sound waves pass through your skin.  A handheld device, called a transducer, will be pressed against your chest and moved over your heart. The  transducer produces sound waves that travel to your heart and bounce back (or "echo" back) to the transducer. These sound waves will be captured in real-time and changed into images of your heart that can be viewed on a video monitor. The images will be recorded on a computer and reviewed by your health care provider.  You may be asked to change positions or hold your breath for a short time. This makes it easier to get different views or better views of your heart.  In some cases, you may receive contrast through an IV in one of your veins. This can improve the quality of the pictures from your heart. The procedure may vary among health care providers and hospitals.   What can I expect after the test? You may return to your normal, everyday life, including diet, activities, and medicines, unless your health care provider tells you not to do that. Follow these instructions at home:  It is up to you to get the results of your test. Ask your health care provider, or the department that is doing the test, when your results will be ready.  Keep all follow-up visits. This is important. Summary  An echocardiogram is a test that uses sound waves (ultrasound) to produce images of the heart.  Images from an echocardiogram can provide important information about the size and shape of your heart, heart muscle function, heart valve function, and other possible heart problems.  You do not need to do anything to prepare before this test. You may eat and drink normally.  After the echocardiogram is completed, you may return to your normal, everyday life, unless your health care provider tells you not to do that. This information is not intended to replace advice given to you by your health care provider. Make sure you discuss any questions you have with your health care provider. Document Revised: 11/19/2019 Document Reviewed: 11/19/2019 Elsevier Patient Education  2021 Elsevier Inc.   

## 2020-12-01 ENCOUNTER — Other Ambulatory Visit: Payer: Self-pay

## 2020-12-01 ENCOUNTER — Ambulatory Visit (INDEPENDENT_AMBULATORY_CARE_PROVIDER_SITE_OTHER): Payer: HMO

## 2020-12-01 DIAGNOSIS — N189 Chronic kidney disease, unspecified: Secondary | ICD-10-CM | POA: Diagnosis not present

## 2020-12-01 DIAGNOSIS — R011 Cardiac murmur, unspecified: Secondary | ICD-10-CM

## 2020-12-01 LAB — ECHOCARDIOGRAM COMPLETE: S' Lateral: 2.6 cm

## 2020-12-02 ENCOUNTER — Telehealth: Payer: Self-pay | Admitting: Cardiology

## 2020-12-02 NOTE — Telephone Encounter (Signed)
Patient was returning call 

## 2020-12-09 DIAGNOSIS — E7800 Pure hypercholesterolemia, unspecified: Secondary | ICD-10-CM | POA: Diagnosis not present

## 2020-12-09 DIAGNOSIS — N189 Chronic kidney disease, unspecified: Secondary | ICD-10-CM | POA: Diagnosis not present

## 2020-12-09 DIAGNOSIS — K219 Gastro-esophageal reflux disease without esophagitis: Secondary | ICD-10-CM | POA: Diagnosis not present

## 2020-12-15 DIAGNOSIS — L97429 Non-pressure chronic ulcer of left heel and midfoot with unspecified severity: Secondary | ICD-10-CM | POA: Diagnosis not present

## 2020-12-15 DIAGNOSIS — E11621 Type 2 diabetes mellitus with foot ulcer: Secondary | ICD-10-CM | POA: Diagnosis not present

## 2020-12-15 DIAGNOSIS — M14672 Charcot's joint, left ankle and foot: Secondary | ICD-10-CM | POA: Diagnosis not present

## 2020-12-15 DIAGNOSIS — L84 Corns and callosities: Secondary | ICD-10-CM | POA: Diagnosis not present

## 2020-12-15 DIAGNOSIS — E1143 Type 2 diabetes mellitus with diabetic autonomic (poly)neuropathy: Secondary | ICD-10-CM | POA: Diagnosis not present

## 2021-01-08 DIAGNOSIS — D510 Vitamin B12 deficiency anemia due to intrinsic factor deficiency: Secondary | ICD-10-CM | POA: Diagnosis not present

## 2021-01-08 DIAGNOSIS — E7800 Pure hypercholesterolemia, unspecified: Secondary | ICD-10-CM | POA: Diagnosis not present

## 2021-01-08 DIAGNOSIS — I10 Essential (primary) hypertension: Secondary | ICD-10-CM | POA: Diagnosis not present

## 2021-01-08 DIAGNOSIS — E1165 Type 2 diabetes mellitus with hyperglycemia: Secondary | ICD-10-CM | POA: Diagnosis not present

## 2021-01-08 DIAGNOSIS — M109 Gout, unspecified: Secondary | ICD-10-CM | POA: Diagnosis not present

## 2021-01-12 DIAGNOSIS — L97429 Non-pressure chronic ulcer of left heel and midfoot with unspecified severity: Secondary | ICD-10-CM | POA: Diagnosis not present

## 2021-01-12 DIAGNOSIS — M14672 Charcot's joint, left ankle and foot: Secondary | ICD-10-CM | POA: Diagnosis not present

## 2021-01-12 DIAGNOSIS — Z89432 Acquired absence of left foot: Secondary | ICD-10-CM | POA: Diagnosis not present

## 2021-01-12 DIAGNOSIS — E11621 Type 2 diabetes mellitus with foot ulcer: Secondary | ICD-10-CM | POA: Diagnosis not present

## 2021-01-12 DIAGNOSIS — E1143 Type 2 diabetes mellitus with diabetic autonomic (poly)neuropathy: Secondary | ICD-10-CM | POA: Diagnosis not present

## 2021-01-21 DIAGNOSIS — Z79899 Other long term (current) drug therapy: Secondary | ICD-10-CM | POA: Diagnosis not present

## 2021-01-21 DIAGNOSIS — J3489 Other specified disorders of nose and nasal sinuses: Secondary | ICD-10-CM | POA: Diagnosis not present

## 2021-01-21 DIAGNOSIS — I10 Essential (primary) hypertension: Secondary | ICD-10-CM | POA: Diagnosis not present

## 2021-01-21 DIAGNOSIS — N179 Acute kidney failure, unspecified: Secondary | ICD-10-CM | POA: Diagnosis not present

## 2021-01-21 DIAGNOSIS — R55 Syncope and collapse: Secondary | ICD-10-CM | POA: Diagnosis not present

## 2021-01-21 DIAGNOSIS — R112 Nausea with vomiting, unspecified: Secondary | ICD-10-CM | POA: Diagnosis not present

## 2021-01-21 DIAGNOSIS — Z7982 Long term (current) use of aspirin: Secondary | ICD-10-CM | POA: Diagnosis not present

## 2021-01-21 DIAGNOSIS — R1084 Generalized abdominal pain: Secondary | ICD-10-CM | POA: Diagnosis not present

## 2021-01-21 DIAGNOSIS — M791 Myalgia, unspecified site: Secondary | ICD-10-CM | POA: Diagnosis not present

## 2021-01-21 DIAGNOSIS — R197 Diarrhea, unspecified: Secondary | ICD-10-CM | POA: Diagnosis not present

## 2021-01-21 DIAGNOSIS — R000 Tachycardia, unspecified: Secondary | ICD-10-CM | POA: Diagnosis not present

## 2021-01-21 DIAGNOSIS — E119 Type 2 diabetes mellitus without complications: Secondary | ICD-10-CM | POA: Diagnosis not present

## 2021-01-22 DIAGNOSIS — R1084 Generalized abdominal pain: Secondary | ICD-10-CM | POA: Diagnosis not present

## 2021-01-25 DIAGNOSIS — K529 Noninfective gastroenteritis and colitis, unspecified: Secondary | ICD-10-CM | POA: Diagnosis not present

## 2021-01-25 DIAGNOSIS — Z6836 Body mass index (BMI) 36.0-36.9, adult: Secondary | ICD-10-CM | POA: Diagnosis not present

## 2021-01-25 DIAGNOSIS — E1129 Type 2 diabetes mellitus with other diabetic kidney complication: Secondary | ICD-10-CM | POA: Diagnosis not present

## 2021-01-25 DIAGNOSIS — Z1331 Encounter for screening for depression: Secondary | ICD-10-CM | POA: Diagnosis not present

## 2021-01-25 DIAGNOSIS — I10 Essential (primary) hypertension: Secondary | ICD-10-CM | POA: Diagnosis not present

## 2021-01-27 DIAGNOSIS — E1122 Type 2 diabetes mellitus with diabetic chronic kidney disease: Secondary | ICD-10-CM | POA: Diagnosis not present

## 2021-01-27 DIAGNOSIS — E1143 Type 2 diabetes mellitus with diabetic autonomic (poly)neuropathy: Secondary | ICD-10-CM | POA: Diagnosis not present

## 2021-01-27 DIAGNOSIS — Z89432 Acquired absence of left foot: Secondary | ICD-10-CM | POA: Diagnosis not present

## 2021-01-27 DIAGNOSIS — N189 Chronic kidney disease, unspecified: Secondary | ICD-10-CM | POA: Diagnosis not present

## 2021-01-27 DIAGNOSIS — N183 Chronic kidney disease, stage 3 unspecified: Secondary | ICD-10-CM | POA: Diagnosis not present

## 2021-01-27 DIAGNOSIS — N2581 Secondary hyperparathyroidism of renal origin: Secondary | ICD-10-CM | POA: Diagnosis not present

## 2021-01-27 DIAGNOSIS — E875 Hyperkalemia: Secondary | ICD-10-CM | POA: Diagnosis not present

## 2021-01-27 DIAGNOSIS — I129 Hypertensive chronic kidney disease with stage 1 through stage 4 chronic kidney disease, or unspecified chronic kidney disease: Secondary | ICD-10-CM | POA: Diagnosis not present

## 2021-02-08 DIAGNOSIS — M109 Gout, unspecified: Secondary | ICD-10-CM | POA: Diagnosis not present

## 2021-02-08 DIAGNOSIS — D510 Vitamin B12 deficiency anemia due to intrinsic factor deficiency: Secondary | ICD-10-CM | POA: Diagnosis not present

## 2021-02-08 DIAGNOSIS — E7800 Pure hypercholesterolemia, unspecified: Secondary | ICD-10-CM | POA: Diagnosis not present

## 2021-02-08 DIAGNOSIS — E1165 Type 2 diabetes mellitus with hyperglycemia: Secondary | ICD-10-CM | POA: Diagnosis not present

## 2021-02-26 DIAGNOSIS — I951 Orthostatic hypotension: Secondary | ICD-10-CM | POA: Diagnosis not present

## 2021-02-26 DIAGNOSIS — E7800 Pure hypercholesterolemia, unspecified: Secondary | ICD-10-CM | POA: Diagnosis not present

## 2021-02-26 DIAGNOSIS — I10 Essential (primary) hypertension: Secondary | ICD-10-CM | POA: Diagnosis not present

## 2021-02-26 DIAGNOSIS — Z6836 Body mass index (BMI) 36.0-36.9, adult: Secondary | ICD-10-CM | POA: Diagnosis not present

## 2021-03-09 DIAGNOSIS — E1143 Type 2 diabetes mellitus with diabetic autonomic (poly)neuropathy: Secondary | ICD-10-CM | POA: Diagnosis not present

## 2021-03-09 DIAGNOSIS — L97429 Non-pressure chronic ulcer of left heel and midfoot with unspecified severity: Secondary | ICD-10-CM | POA: Diagnosis not present

## 2021-03-09 DIAGNOSIS — Z89432 Acquired absence of left foot: Secondary | ICD-10-CM | POA: Diagnosis not present

## 2021-03-09 DIAGNOSIS — M14672 Charcot's joint, left ankle and foot: Secondary | ICD-10-CM | POA: Diagnosis not present

## 2021-03-09 DIAGNOSIS — E11621 Type 2 diabetes mellitus with foot ulcer: Secondary | ICD-10-CM | POA: Diagnosis not present

## 2021-03-10 DIAGNOSIS — M109 Gout, unspecified: Secondary | ICD-10-CM | POA: Diagnosis not present

## 2021-03-10 DIAGNOSIS — D510 Vitamin B12 deficiency anemia due to intrinsic factor deficiency: Secondary | ICD-10-CM | POA: Diagnosis not present

## 2021-03-10 DIAGNOSIS — I10 Essential (primary) hypertension: Secondary | ICD-10-CM | POA: Diagnosis not present

## 2021-03-10 DIAGNOSIS — E7800 Pure hypercholesterolemia, unspecified: Secondary | ICD-10-CM | POA: Diagnosis not present

## 2021-03-29 DIAGNOSIS — Z0000 Encounter for general adult medical examination without abnormal findings: Secondary | ICD-10-CM | POA: Diagnosis not present

## 2021-03-30 DIAGNOSIS — E1143 Type 2 diabetes mellitus with diabetic autonomic (poly)neuropathy: Secondary | ICD-10-CM | POA: Diagnosis not present

## 2021-03-30 DIAGNOSIS — Z89432 Acquired absence of left foot: Secondary | ICD-10-CM | POA: Diagnosis not present

## 2021-03-30 DIAGNOSIS — M14672 Charcot's joint, left ankle and foot: Secondary | ICD-10-CM | POA: Diagnosis not present

## 2021-03-30 DIAGNOSIS — E11621 Type 2 diabetes mellitus with foot ulcer: Secondary | ICD-10-CM | POA: Diagnosis not present

## 2021-03-30 DIAGNOSIS — L97429 Non-pressure chronic ulcer of left heel and midfoot with unspecified severity: Secondary | ICD-10-CM | POA: Diagnosis not present

## 2021-04-09 DIAGNOSIS — I10 Essential (primary) hypertension: Secondary | ICD-10-CM | POA: Diagnosis not present

## 2021-04-09 DIAGNOSIS — D510 Vitamin B12 deficiency anemia due to intrinsic factor deficiency: Secondary | ICD-10-CM | POA: Diagnosis not present

## 2021-04-09 DIAGNOSIS — E7800 Pure hypercholesterolemia, unspecified: Secondary | ICD-10-CM | POA: Diagnosis not present

## 2021-04-09 DIAGNOSIS — M109 Gout, unspecified: Secondary | ICD-10-CM | POA: Diagnosis not present

## 2021-04-13 DIAGNOSIS — J029 Acute pharyngitis, unspecified: Secondary | ICD-10-CM | POA: Diagnosis not present

## 2021-04-27 DIAGNOSIS — E11621 Type 2 diabetes mellitus with foot ulcer: Secondary | ICD-10-CM | POA: Diagnosis not present

## 2021-04-27 DIAGNOSIS — Z89432 Acquired absence of left foot: Secondary | ICD-10-CM | POA: Diagnosis not present

## 2021-04-27 DIAGNOSIS — M14672 Charcot's joint, left ankle and foot: Secondary | ICD-10-CM | POA: Diagnosis not present

## 2021-04-27 DIAGNOSIS — E1143 Type 2 diabetes mellitus with diabetic autonomic (poly)neuropathy: Secondary | ICD-10-CM | POA: Diagnosis not present

## 2021-04-27 DIAGNOSIS — L97429 Non-pressure chronic ulcer of left heel and midfoot with unspecified severity: Secondary | ICD-10-CM | POA: Diagnosis not present

## 2021-05-03 DIAGNOSIS — E7800 Pure hypercholesterolemia, unspecified: Secondary | ICD-10-CM | POA: Diagnosis not present

## 2021-05-03 DIAGNOSIS — E1165 Type 2 diabetes mellitus with hyperglycemia: Secondary | ICD-10-CM | POA: Diagnosis not present

## 2021-05-03 DIAGNOSIS — Z125 Encounter for screening for malignant neoplasm of prostate: Secondary | ICD-10-CM | POA: Diagnosis not present

## 2021-05-11 DIAGNOSIS — I10 Essential (primary) hypertension: Secondary | ICD-10-CM | POA: Diagnosis not present

## 2021-05-11 DIAGNOSIS — E7800 Pure hypercholesterolemia, unspecified: Secondary | ICD-10-CM | POA: Diagnosis not present

## 2021-05-11 DIAGNOSIS — M109 Gout, unspecified: Secondary | ICD-10-CM | POA: Diagnosis not present

## 2021-05-11 DIAGNOSIS — D510 Vitamin B12 deficiency anemia due to intrinsic factor deficiency: Secondary | ICD-10-CM | POA: Diagnosis not present

## 2021-05-18 DIAGNOSIS — E1122 Type 2 diabetes mellitus with diabetic chronic kidney disease: Secondary | ICD-10-CM | POA: Diagnosis not present

## 2021-05-18 DIAGNOSIS — N2581 Secondary hyperparathyroidism of renal origin: Secondary | ICD-10-CM | POA: Diagnosis not present

## 2021-05-18 DIAGNOSIS — N183 Chronic kidney disease, stage 3 unspecified: Secondary | ICD-10-CM | POA: Diagnosis not present

## 2021-05-18 DIAGNOSIS — I129 Hypertensive chronic kidney disease with stage 1 through stage 4 chronic kidney disease, or unspecified chronic kidney disease: Secondary | ICD-10-CM | POA: Diagnosis not present

## 2021-05-25 DIAGNOSIS — L97429 Non-pressure chronic ulcer of left heel and midfoot with unspecified severity: Secondary | ICD-10-CM | POA: Diagnosis not present

## 2021-05-25 DIAGNOSIS — M14672 Charcot's joint, left ankle and foot: Secondary | ICD-10-CM | POA: Diagnosis not present

## 2021-05-25 DIAGNOSIS — E11621 Type 2 diabetes mellitus with foot ulcer: Secondary | ICD-10-CM | POA: Diagnosis not present

## 2021-05-25 DIAGNOSIS — Z89439 Acquired absence of unspecified foot: Secondary | ICD-10-CM | POA: Diagnosis not present

## 2021-05-25 DIAGNOSIS — E1143 Type 2 diabetes mellitus with diabetic autonomic (poly)neuropathy: Secondary | ICD-10-CM | POA: Diagnosis not present

## 2021-06-08 DIAGNOSIS — E7800 Pure hypercholesterolemia, unspecified: Secondary | ICD-10-CM | POA: Diagnosis not present

## 2021-06-08 DIAGNOSIS — M109 Gout, unspecified: Secondary | ICD-10-CM | POA: Diagnosis not present

## 2021-06-08 DIAGNOSIS — D519 Vitamin B12 deficiency anemia, unspecified: Secondary | ICD-10-CM | POA: Diagnosis not present

## 2021-06-08 DIAGNOSIS — I10 Essential (primary) hypertension: Secondary | ICD-10-CM | POA: Diagnosis not present

## 2021-06-14 DIAGNOSIS — E7800 Pure hypercholesterolemia, unspecified: Secondary | ICD-10-CM | POA: Diagnosis not present

## 2021-06-14 DIAGNOSIS — Z91199 Patient's noncompliance with other medical treatment and regimen due to unspecified reason: Secondary | ICD-10-CM | POA: Diagnosis not present

## 2021-06-14 DIAGNOSIS — Z794 Long term (current) use of insulin: Secondary | ICD-10-CM | POA: Diagnosis not present

## 2021-06-14 DIAGNOSIS — E1165 Type 2 diabetes mellitus with hyperglycemia: Secondary | ICD-10-CM | POA: Diagnosis not present

## 2021-06-14 DIAGNOSIS — Z6836 Body mass index (BMI) 36.0-36.9, adult: Secondary | ICD-10-CM | POA: Diagnosis not present

## 2021-06-14 DIAGNOSIS — R269 Unspecified abnormalities of gait and mobility: Secondary | ICD-10-CM | POA: Diagnosis not present

## 2021-06-14 DIAGNOSIS — M109 Gout, unspecified: Secondary | ICD-10-CM | POA: Diagnosis not present

## 2021-06-14 DIAGNOSIS — D510 Vitamin B12 deficiency anemia due to intrinsic factor deficiency: Secondary | ICD-10-CM | POA: Diagnosis not present

## 2021-06-22 DIAGNOSIS — L97429 Non-pressure chronic ulcer of left heel and midfoot with unspecified severity: Secondary | ICD-10-CM | POA: Diagnosis not present

## 2021-06-22 DIAGNOSIS — E1143 Type 2 diabetes mellitus with diabetic autonomic (poly)neuropathy: Secondary | ICD-10-CM | POA: Diagnosis not present

## 2021-06-22 DIAGNOSIS — Z89432 Acquired absence of left foot: Secondary | ICD-10-CM | POA: Diagnosis not present

## 2021-06-22 DIAGNOSIS — M14672 Charcot's joint, left ankle and foot: Secondary | ICD-10-CM | POA: Diagnosis not present

## 2021-06-22 DIAGNOSIS — E11621 Type 2 diabetes mellitus with foot ulcer: Secondary | ICD-10-CM | POA: Diagnosis not present

## 2021-07-09 DIAGNOSIS — E1165 Type 2 diabetes mellitus with hyperglycemia: Secondary | ICD-10-CM | POA: Diagnosis not present

## 2021-07-09 DIAGNOSIS — E7800 Pure hypercholesterolemia, unspecified: Secondary | ICD-10-CM | POA: Diagnosis not present

## 2021-07-09 DIAGNOSIS — D510 Vitamin B12 deficiency anemia due to intrinsic factor deficiency: Secondary | ICD-10-CM | POA: Diagnosis not present

## 2021-07-09 DIAGNOSIS — M109 Gout, unspecified: Secondary | ICD-10-CM | POA: Diagnosis not present

## 2021-07-22 DIAGNOSIS — M14672 Charcot's joint, left ankle and foot: Secondary | ICD-10-CM | POA: Diagnosis not present

## 2021-07-22 DIAGNOSIS — E11621 Type 2 diabetes mellitus with foot ulcer: Secondary | ICD-10-CM | POA: Diagnosis not present

## 2021-07-22 DIAGNOSIS — Z89439 Acquired absence of unspecified foot: Secondary | ICD-10-CM | POA: Diagnosis not present

## 2021-07-22 DIAGNOSIS — L97429 Non-pressure chronic ulcer of left heel and midfoot with unspecified severity: Secondary | ICD-10-CM | POA: Diagnosis not present

## 2021-07-22 DIAGNOSIS — E1143 Type 2 diabetes mellitus with diabetic autonomic (poly)neuropathy: Secondary | ICD-10-CM | POA: Diagnosis not present

## 2021-08-03 DIAGNOSIS — E1165 Type 2 diabetes mellitus with hyperglycemia: Secondary | ICD-10-CM | POA: Diagnosis not present

## 2021-08-08 DIAGNOSIS — E1165 Type 2 diabetes mellitus with hyperglycemia: Secondary | ICD-10-CM | POA: Diagnosis not present

## 2021-08-08 DIAGNOSIS — E7800 Pure hypercholesterolemia, unspecified: Secondary | ICD-10-CM | POA: Diagnosis not present

## 2021-08-08 DIAGNOSIS — D510 Vitamin B12 deficiency anemia due to intrinsic factor deficiency: Secondary | ICD-10-CM | POA: Diagnosis not present

## 2021-08-08 DIAGNOSIS — M109 Gout, unspecified: Secondary | ICD-10-CM | POA: Diagnosis not present

## 2021-08-19 DIAGNOSIS — E1143 Type 2 diabetes mellitus with diabetic autonomic (poly)neuropathy: Secondary | ICD-10-CM | POA: Diagnosis not present

## 2021-08-19 DIAGNOSIS — M14672 Charcot's joint, left ankle and foot: Secondary | ICD-10-CM | POA: Diagnosis not present

## 2021-08-19 DIAGNOSIS — E11621 Type 2 diabetes mellitus with foot ulcer: Secondary | ICD-10-CM | POA: Diagnosis not present

## 2021-08-19 DIAGNOSIS — L97429 Non-pressure chronic ulcer of left heel and midfoot with unspecified severity: Secondary | ICD-10-CM | POA: Diagnosis not present

## 2021-09-02 DIAGNOSIS — E11621 Type 2 diabetes mellitus with foot ulcer: Secondary | ICD-10-CM | POA: Diagnosis not present

## 2021-09-02 DIAGNOSIS — L97429 Non-pressure chronic ulcer of left heel and midfoot with unspecified severity: Secondary | ICD-10-CM | POA: Diagnosis not present

## 2021-09-08 DIAGNOSIS — D510 Vitamin B12 deficiency anemia due to intrinsic factor deficiency: Secondary | ICD-10-CM | POA: Diagnosis not present

## 2021-09-08 DIAGNOSIS — E1165 Type 2 diabetes mellitus with hyperglycemia: Secondary | ICD-10-CM | POA: Diagnosis not present

## 2021-09-08 DIAGNOSIS — M109 Gout, unspecified: Secondary | ICD-10-CM | POA: Diagnosis not present

## 2021-09-08 DIAGNOSIS — E7800 Pure hypercholesterolemia, unspecified: Secondary | ICD-10-CM | POA: Diagnosis not present

## 2021-09-09 DIAGNOSIS — N1831 Chronic kidney disease, stage 3a: Secondary | ICD-10-CM | POA: Diagnosis not present

## 2021-09-09 DIAGNOSIS — E1143 Type 2 diabetes mellitus with diabetic autonomic (poly)neuropathy: Secondary | ICD-10-CM | POA: Diagnosis not present

## 2021-09-09 DIAGNOSIS — E875 Hyperkalemia: Secondary | ICD-10-CM | POA: Diagnosis not present

## 2021-09-09 DIAGNOSIS — E1129 Type 2 diabetes mellitus with other diabetic kidney complication: Secondary | ICD-10-CM | POA: Diagnosis not present

## 2021-09-09 DIAGNOSIS — E1122 Type 2 diabetes mellitus with diabetic chronic kidney disease: Secondary | ICD-10-CM | POA: Diagnosis not present

## 2021-09-09 DIAGNOSIS — R809 Proteinuria, unspecified: Secondary | ICD-10-CM | POA: Diagnosis not present

## 2021-09-09 DIAGNOSIS — I129 Hypertensive chronic kidney disease with stage 1 through stage 4 chronic kidney disease, or unspecified chronic kidney disease: Secondary | ICD-10-CM | POA: Diagnosis not present

## 2021-09-21 DIAGNOSIS — E1143 Type 2 diabetes mellitus with diabetic autonomic (poly)neuropathy: Secondary | ICD-10-CM | POA: Diagnosis not present

## 2021-09-21 DIAGNOSIS — M14672 Charcot's joint, left ankle and foot: Secondary | ICD-10-CM | POA: Diagnosis not present

## 2021-09-21 DIAGNOSIS — Z89432 Acquired absence of left foot: Secondary | ICD-10-CM | POA: Diagnosis not present

## 2021-10-08 DIAGNOSIS — E7800 Pure hypercholesterolemia, unspecified: Secondary | ICD-10-CM | POA: Diagnosis not present

## 2021-10-08 DIAGNOSIS — M109 Gout, unspecified: Secondary | ICD-10-CM | POA: Diagnosis not present

## 2021-10-08 DIAGNOSIS — D510 Vitamin B12 deficiency anemia due to intrinsic factor deficiency: Secondary | ICD-10-CM | POA: Diagnosis not present

## 2021-10-08 DIAGNOSIS — E1165 Type 2 diabetes mellitus with hyperglycemia: Secondary | ICD-10-CM | POA: Diagnosis not present

## 2021-10-20 DIAGNOSIS — E11621 Type 2 diabetes mellitus with foot ulcer: Secondary | ICD-10-CM | POA: Diagnosis not present

## 2021-10-20 DIAGNOSIS — Z89432 Acquired absence of left foot: Secondary | ICD-10-CM | POA: Diagnosis not present

## 2021-10-20 DIAGNOSIS — L97429 Non-pressure chronic ulcer of left heel and midfoot with unspecified severity: Secondary | ICD-10-CM | POA: Diagnosis not present

## 2021-10-20 DIAGNOSIS — M14672 Charcot's joint, left ankle and foot: Secondary | ICD-10-CM | POA: Diagnosis not present

## 2021-10-20 DIAGNOSIS — E1143 Type 2 diabetes mellitus with diabetic autonomic (poly)neuropathy: Secondary | ICD-10-CM | POA: Diagnosis not present

## 2021-11-08 DIAGNOSIS — D510 Vitamin B12 deficiency anemia due to intrinsic factor deficiency: Secondary | ICD-10-CM | POA: Diagnosis not present

## 2021-11-08 DIAGNOSIS — E7800 Pure hypercholesterolemia, unspecified: Secondary | ICD-10-CM | POA: Diagnosis not present

## 2021-11-08 DIAGNOSIS — M109 Gout, unspecified: Secondary | ICD-10-CM | POA: Diagnosis not present

## 2021-11-08 DIAGNOSIS — E1165 Type 2 diabetes mellitus with hyperglycemia: Secondary | ICD-10-CM | POA: Diagnosis not present

## 2021-11-17 DIAGNOSIS — L97429 Non-pressure chronic ulcer of left heel and midfoot with unspecified severity: Secondary | ICD-10-CM | POA: Diagnosis not present

## 2021-11-17 DIAGNOSIS — Z89432 Acquired absence of left foot: Secondary | ICD-10-CM | POA: Diagnosis not present

## 2021-11-17 DIAGNOSIS — E1143 Type 2 diabetes mellitus with diabetic autonomic (poly)neuropathy: Secondary | ICD-10-CM | POA: Diagnosis not present

## 2021-11-17 DIAGNOSIS — E11621 Type 2 diabetes mellitus with foot ulcer: Secondary | ICD-10-CM | POA: Diagnosis not present

## 2021-12-01 DIAGNOSIS — E1129 Type 2 diabetes mellitus with other diabetic kidney complication: Secondary | ICD-10-CM | POA: Diagnosis not present

## 2021-12-09 DIAGNOSIS — E1165 Type 2 diabetes mellitus with hyperglycemia: Secondary | ICD-10-CM | POA: Diagnosis not present

## 2021-12-09 DIAGNOSIS — E7800 Pure hypercholesterolemia, unspecified: Secondary | ICD-10-CM | POA: Diagnosis not present

## 2021-12-09 DIAGNOSIS — M109 Gout, unspecified: Secondary | ICD-10-CM | POA: Diagnosis not present

## 2021-12-09 DIAGNOSIS — D510 Vitamin B12 deficiency anemia due to intrinsic factor deficiency: Secondary | ICD-10-CM | POA: Diagnosis not present

## 2021-12-15 DIAGNOSIS — Z89432 Acquired absence of left foot: Secondary | ICD-10-CM | POA: Diagnosis not present

## 2021-12-15 DIAGNOSIS — E1143 Type 2 diabetes mellitus with diabetic autonomic (poly)neuropathy: Secondary | ICD-10-CM | POA: Diagnosis not present

## 2021-12-15 DIAGNOSIS — L97429 Non-pressure chronic ulcer of left heel and midfoot with unspecified severity: Secondary | ICD-10-CM | POA: Diagnosis not present

## 2021-12-15 DIAGNOSIS — E11621 Type 2 diabetes mellitus with foot ulcer: Secondary | ICD-10-CM | POA: Diagnosis not present

## 2021-12-15 DIAGNOSIS — M14672 Charcot's joint, left ankle and foot: Secondary | ICD-10-CM | POA: Diagnosis not present

## 2022-01-05 DIAGNOSIS — Z89432 Acquired absence of left foot: Secondary | ICD-10-CM | POA: Diagnosis not present

## 2022-01-05 DIAGNOSIS — L97429 Non-pressure chronic ulcer of left heel and midfoot with unspecified severity: Secondary | ICD-10-CM | POA: Diagnosis not present

## 2022-01-05 DIAGNOSIS — E11621 Type 2 diabetes mellitus with foot ulcer: Secondary | ICD-10-CM | POA: Diagnosis not present

## 2022-01-05 DIAGNOSIS — E1143 Type 2 diabetes mellitus with diabetic autonomic (poly)neuropathy: Secondary | ICD-10-CM | POA: Diagnosis not present

## 2022-01-05 DIAGNOSIS — M14672 Charcot's joint, left ankle and foot: Secondary | ICD-10-CM | POA: Diagnosis not present

## 2022-01-12 DIAGNOSIS — L97429 Non-pressure chronic ulcer of left heel and midfoot with unspecified severity: Secondary | ICD-10-CM | POA: Diagnosis not present

## 2022-01-12 DIAGNOSIS — E11621 Type 2 diabetes mellitus with foot ulcer: Secondary | ICD-10-CM | POA: Diagnosis not present

## 2022-01-19 DIAGNOSIS — L97429 Non-pressure chronic ulcer of left heel and midfoot with unspecified severity: Secondary | ICD-10-CM | POA: Diagnosis not present

## 2022-01-19 DIAGNOSIS — E11621 Type 2 diabetes mellitus with foot ulcer: Secondary | ICD-10-CM | POA: Diagnosis not present

## 2022-04-18 DIAGNOSIS — L97429 Non-pressure chronic ulcer of left heel and midfoot with unspecified severity: Secondary | ICD-10-CM | POA: Diagnosis not present

## 2022-04-18 DIAGNOSIS — E11621 Type 2 diabetes mellitus with foot ulcer: Secondary | ICD-10-CM | POA: Diagnosis not present

## 2022-04-25 DIAGNOSIS — L97429 Non-pressure chronic ulcer of left heel and midfoot with unspecified severity: Secondary | ICD-10-CM | POA: Diagnosis not present

## 2022-04-25 DIAGNOSIS — Z89432 Acquired absence of left foot: Secondary | ICD-10-CM | POA: Diagnosis not present

## 2022-04-25 DIAGNOSIS — E1143 Type 2 diabetes mellitus with diabetic autonomic (poly)neuropathy: Secondary | ICD-10-CM | POA: Diagnosis not present

## 2022-04-25 DIAGNOSIS — M14672 Charcot's joint, left ankle and foot: Secondary | ICD-10-CM | POA: Diagnosis not present

## 2022-04-25 DIAGNOSIS — E11621 Type 2 diabetes mellitus with foot ulcer: Secondary | ICD-10-CM | POA: Diagnosis not present

## 2022-05-03 DIAGNOSIS — E11621 Type 2 diabetes mellitus with foot ulcer: Secondary | ICD-10-CM | POA: Diagnosis not present

## 2022-05-03 DIAGNOSIS — L97429 Non-pressure chronic ulcer of left heel and midfoot with unspecified severity: Secondary | ICD-10-CM | POA: Diagnosis not present

## 2022-05-10 DIAGNOSIS — E11621 Type 2 diabetes mellitus with foot ulcer: Secondary | ICD-10-CM | POA: Diagnosis not present

## 2022-05-10 DIAGNOSIS — L97429 Non-pressure chronic ulcer of left heel and midfoot with unspecified severity: Secondary | ICD-10-CM | POA: Diagnosis not present

## 2022-05-17 DIAGNOSIS — E1143 Type 2 diabetes mellitus with diabetic autonomic (poly)neuropathy: Secondary | ICD-10-CM | POA: Diagnosis not present

## 2022-05-17 DIAGNOSIS — L97429 Non-pressure chronic ulcer of left heel and midfoot with unspecified severity: Secondary | ICD-10-CM | POA: Diagnosis not present

## 2022-05-17 DIAGNOSIS — E11621 Type 2 diabetes mellitus with foot ulcer: Secondary | ICD-10-CM | POA: Diagnosis not present

## 2022-05-17 DIAGNOSIS — M14672 Charcot's joint, left ankle and foot: Secondary | ICD-10-CM | POA: Diagnosis not present

## 2022-05-24 DIAGNOSIS — E1129 Type 2 diabetes mellitus with other diabetic kidney complication: Secondary | ICD-10-CM | POA: Diagnosis not present

## 2022-05-25 DIAGNOSIS — L97909 Non-pressure chronic ulcer of unspecified part of unspecified lower leg with unspecified severity: Secondary | ICD-10-CM | POA: Diagnosis not present

## 2022-05-25 DIAGNOSIS — E1143 Type 2 diabetes mellitus with diabetic autonomic (poly)neuropathy: Secondary | ICD-10-CM | POA: Diagnosis not present

## 2022-05-25 DIAGNOSIS — M14672 Charcot's joint, left ankle and foot: Secondary | ICD-10-CM | POA: Diagnosis not present

## 2022-05-25 DIAGNOSIS — L97429 Non-pressure chronic ulcer of left heel and midfoot with unspecified severity: Secondary | ICD-10-CM | POA: Diagnosis not present

## 2022-05-25 DIAGNOSIS — E11621 Type 2 diabetes mellitus with foot ulcer: Secondary | ICD-10-CM | POA: Diagnosis not present

## 2022-05-25 DIAGNOSIS — Z89432 Acquired absence of left foot: Secondary | ICD-10-CM | POA: Diagnosis not present

## 2022-06-01 DIAGNOSIS — E11621 Type 2 diabetes mellitus with foot ulcer: Secondary | ICD-10-CM | POA: Diagnosis not present

## 2022-06-01 DIAGNOSIS — L97421 Non-pressure chronic ulcer of left heel and midfoot limited to breakdown of skin: Secondary | ICD-10-CM | POA: Diagnosis not present

## 2022-06-08 DIAGNOSIS — E11621 Type 2 diabetes mellitus with foot ulcer: Secondary | ICD-10-CM | POA: Diagnosis not present

## 2022-06-08 DIAGNOSIS — L97421 Non-pressure chronic ulcer of left heel and midfoot limited to breakdown of skin: Secondary | ICD-10-CM | POA: Diagnosis not present

## 2022-06-15 DIAGNOSIS — E1143 Type 2 diabetes mellitus with diabetic autonomic (poly)neuropathy: Secondary | ICD-10-CM | POA: Diagnosis not present

## 2022-06-15 DIAGNOSIS — M14672 Charcot's joint, left ankle and foot: Secondary | ICD-10-CM | POA: Diagnosis not present

## 2022-06-15 DIAGNOSIS — M25376 Other instability, unspecified foot: Secondary | ICD-10-CM | POA: Diagnosis not present

## 2022-06-15 DIAGNOSIS — L97421 Non-pressure chronic ulcer of left heel and midfoot limited to breakdown of skin: Secondary | ICD-10-CM | POA: Diagnosis not present

## 2022-06-15 DIAGNOSIS — E11621 Type 2 diabetes mellitus with foot ulcer: Secondary | ICD-10-CM | POA: Diagnosis not present

## 2022-06-15 DIAGNOSIS — Z89432 Acquired absence of left foot: Secondary | ICD-10-CM | POA: Diagnosis not present

## 2022-06-21 DIAGNOSIS — E1165 Type 2 diabetes mellitus with hyperglycemia: Secondary | ICD-10-CM | POA: Diagnosis not present

## 2022-06-21 DIAGNOSIS — Z6838 Body mass index (BMI) 38.0-38.9, adult: Secondary | ICD-10-CM | POA: Diagnosis not present

## 2022-06-21 DIAGNOSIS — D510 Vitamin B12 deficiency anemia due to intrinsic factor deficiency: Secondary | ICD-10-CM | POA: Diagnosis not present

## 2022-06-21 DIAGNOSIS — Z0000 Encounter for general adult medical examination without abnormal findings: Secondary | ICD-10-CM | POA: Diagnosis not present

## 2022-06-21 DIAGNOSIS — Z794 Long term (current) use of insulin: Secondary | ICD-10-CM | POA: Diagnosis not present

## 2022-06-21 DIAGNOSIS — M109 Gout, unspecified: Secondary | ICD-10-CM | POA: Diagnosis not present

## 2022-06-21 DIAGNOSIS — Z125 Encounter for screening for malignant neoplasm of prostate: Secondary | ICD-10-CM | POA: Diagnosis not present

## 2022-06-21 DIAGNOSIS — M1460 Charcot's joint, unspecified site: Secondary | ICD-10-CM | POA: Diagnosis not present

## 2022-06-21 DIAGNOSIS — E7800 Pure hypercholesterolemia, unspecified: Secondary | ICD-10-CM | POA: Diagnosis not present

## 2022-06-21 DIAGNOSIS — N183 Chronic kidney disease, stage 3 unspecified: Secondary | ICD-10-CM | POA: Diagnosis not present

## 2022-06-21 DIAGNOSIS — R269 Unspecified abnormalities of gait and mobility: Secondary | ICD-10-CM | POA: Diagnosis not present

## 2022-06-21 DIAGNOSIS — Z79899 Other long term (current) drug therapy: Secondary | ICD-10-CM | POA: Diagnosis not present

## 2022-06-28 DIAGNOSIS — L97421 Non-pressure chronic ulcer of left heel and midfoot limited to breakdown of skin: Secondary | ICD-10-CM | POA: Diagnosis not present

## 2022-06-28 DIAGNOSIS — E1143 Type 2 diabetes mellitus with diabetic autonomic (poly)neuropathy: Secondary | ICD-10-CM | POA: Diagnosis not present

## 2022-06-28 DIAGNOSIS — E11621 Type 2 diabetes mellitus with foot ulcer: Secondary | ICD-10-CM | POA: Diagnosis not present

## 2022-06-28 DIAGNOSIS — M14672 Charcot's joint, left ankle and foot: Secondary | ICD-10-CM | POA: Diagnosis not present

## 2022-07-10 DIAGNOSIS — N1831 Chronic kidney disease, stage 3a: Secondary | ICD-10-CM | POA: Diagnosis not present

## 2022-07-10 DIAGNOSIS — E1129 Type 2 diabetes mellitus with other diabetic kidney complication: Secondary | ICD-10-CM | POA: Diagnosis not present

## 2022-08-09 DIAGNOSIS — N1831 Chronic kidney disease, stage 3a: Secondary | ICD-10-CM | POA: Diagnosis not present

## 2022-08-09 DIAGNOSIS — E1129 Type 2 diabetes mellitus with other diabetic kidney complication: Secondary | ICD-10-CM | POA: Diagnosis not present

## 2022-08-10 DIAGNOSIS — L97429 Non-pressure chronic ulcer of left heel and midfoot with unspecified severity: Secondary | ICD-10-CM | POA: Diagnosis not present

## 2022-08-10 DIAGNOSIS — E13621 Other specified diabetes mellitus with foot ulcer: Secondary | ICD-10-CM | POA: Diagnosis not present

## 2022-08-17 DIAGNOSIS — L97429 Non-pressure chronic ulcer of left heel and midfoot with unspecified severity: Secondary | ICD-10-CM | POA: Diagnosis not present

## 2022-08-17 DIAGNOSIS — E13621 Other specified diabetes mellitus with foot ulcer: Secondary | ICD-10-CM | POA: Diagnosis not present

## 2022-08-24 DIAGNOSIS — E11621 Type 2 diabetes mellitus with foot ulcer: Secondary | ICD-10-CM | POA: Diagnosis not present

## 2022-08-24 DIAGNOSIS — L97429 Non-pressure chronic ulcer of left heel and midfoot with unspecified severity: Secondary | ICD-10-CM | POA: Diagnosis not present

## 2022-09-01 DIAGNOSIS — E13621 Other specified diabetes mellitus with foot ulcer: Secondary | ICD-10-CM | POA: Diagnosis not present

## 2022-09-01 DIAGNOSIS — L97429 Non-pressure chronic ulcer of left heel and midfoot with unspecified severity: Secondary | ICD-10-CM | POA: Diagnosis not present

## 2022-09-01 DIAGNOSIS — E11621 Type 2 diabetes mellitus with foot ulcer: Secondary | ICD-10-CM | POA: Diagnosis not present

## 2022-09-07 DIAGNOSIS — M14672 Charcot's joint, left ankle and foot: Secondary | ICD-10-CM | POA: Diagnosis not present

## 2022-09-07 DIAGNOSIS — L97429 Non-pressure chronic ulcer of left heel and midfoot with unspecified severity: Secondary | ICD-10-CM | POA: Diagnosis not present

## 2022-09-07 DIAGNOSIS — E1143 Type 2 diabetes mellitus with diabetic autonomic (poly)neuropathy: Secondary | ICD-10-CM | POA: Diagnosis not present

## 2022-09-07 DIAGNOSIS — E11621 Type 2 diabetes mellitus with foot ulcer: Secondary | ICD-10-CM | POA: Diagnosis not present

## 2022-09-07 DIAGNOSIS — Z89432 Acquired absence of left foot: Secondary | ICD-10-CM | POA: Diagnosis not present

## 2022-09-14 DIAGNOSIS — L97429 Non-pressure chronic ulcer of left heel and midfoot with unspecified severity: Secondary | ICD-10-CM | POA: Diagnosis not present

## 2022-09-14 DIAGNOSIS — E11621 Type 2 diabetes mellitus with foot ulcer: Secondary | ICD-10-CM | POA: Diagnosis not present

## 2022-09-21 DIAGNOSIS — E1122 Type 2 diabetes mellitus with diabetic chronic kidney disease: Secondary | ICD-10-CM | POA: Diagnosis not present

## 2022-09-21 DIAGNOSIS — E1143 Type 2 diabetes mellitus with diabetic autonomic (poly)neuropathy: Secondary | ICD-10-CM | POA: Diagnosis not present

## 2022-09-21 DIAGNOSIS — I129 Hypertensive chronic kidney disease with stage 1 through stage 4 chronic kidney disease, or unspecified chronic kidney disease: Secondary | ICD-10-CM | POA: Diagnosis not present

## 2022-09-21 DIAGNOSIS — N1831 Chronic kidney disease, stage 3a: Secondary | ICD-10-CM | POA: Diagnosis not present

## 2022-09-21 DIAGNOSIS — E875 Hyperkalemia: Secondary | ICD-10-CM | POA: Diagnosis not present

## 2022-09-21 DIAGNOSIS — R809 Proteinuria, unspecified: Secondary | ICD-10-CM | POA: Diagnosis not present

## 2022-09-22 DIAGNOSIS — L97429 Non-pressure chronic ulcer of left heel and midfoot with unspecified severity: Secondary | ICD-10-CM | POA: Diagnosis not present

## 2022-09-22 DIAGNOSIS — E11621 Type 2 diabetes mellitus with foot ulcer: Secondary | ICD-10-CM | POA: Diagnosis not present

## 2022-09-28 DIAGNOSIS — L97422 Non-pressure chronic ulcer of left heel and midfoot with fat layer exposed: Secondary | ICD-10-CM | POA: Diagnosis not present

## 2022-09-28 DIAGNOSIS — L97429 Non-pressure chronic ulcer of left heel and midfoot with unspecified severity: Secondary | ICD-10-CM | POA: Diagnosis not present

## 2022-09-28 DIAGNOSIS — E10621 Type 1 diabetes mellitus with foot ulcer: Secondary | ICD-10-CM | POA: Diagnosis not present

## 2022-09-28 DIAGNOSIS — E11621 Type 2 diabetes mellitus with foot ulcer: Secondary | ICD-10-CM | POA: Diagnosis not present

## 2022-10-05 DIAGNOSIS — E10621 Type 1 diabetes mellitus with foot ulcer: Secondary | ICD-10-CM | POA: Diagnosis not present

## 2022-10-05 DIAGNOSIS — L97422 Non-pressure chronic ulcer of left heel and midfoot with fat layer exposed: Secondary | ICD-10-CM | POA: Diagnosis not present

## 2022-10-05 DIAGNOSIS — E1022 Type 1 diabetes mellitus with diabetic chronic kidney disease: Secondary | ICD-10-CM | POA: Diagnosis not present

## 2022-10-05 DIAGNOSIS — E669 Obesity, unspecified: Secondary | ICD-10-CM | POA: Diagnosis not present

## 2022-10-05 DIAGNOSIS — L97429 Non-pressure chronic ulcer of left heel and midfoot with unspecified severity: Secondary | ICD-10-CM | POA: Diagnosis not present

## 2022-10-05 DIAGNOSIS — N183 Chronic kidney disease, stage 3 unspecified: Secondary | ICD-10-CM | POA: Diagnosis not present

## 2022-10-05 DIAGNOSIS — E1122 Type 2 diabetes mellitus with diabetic chronic kidney disease: Secondary | ICD-10-CM | POA: Diagnosis not present

## 2022-10-05 DIAGNOSIS — K219 Gastro-esophageal reflux disease without esophagitis: Secondary | ICD-10-CM | POA: Diagnosis not present

## 2022-10-05 DIAGNOSIS — E11621 Type 2 diabetes mellitus with foot ulcer: Secondary | ICD-10-CM | POA: Diagnosis not present

## 2022-10-05 DIAGNOSIS — E1043 Type 1 diabetes mellitus with diabetic autonomic (poly)neuropathy: Secondary | ICD-10-CM | POA: Diagnosis not present

## 2022-10-05 DIAGNOSIS — I129 Hypertensive chronic kidney disease with stage 1 through stage 4 chronic kidney disease, or unspecified chronic kidney disease: Secondary | ICD-10-CM | POA: Diagnosis not present

## 2022-10-05 DIAGNOSIS — Z794 Long term (current) use of insulin: Secondary | ICD-10-CM | POA: Diagnosis not present

## 2022-10-19 DIAGNOSIS — M14672 Charcot's joint, left ankle and foot: Secondary | ICD-10-CM | POA: Diagnosis not present

## 2022-10-19 DIAGNOSIS — E10621 Type 1 diabetes mellitus with foot ulcer: Secondary | ICD-10-CM | POA: Diagnosis not present

## 2022-10-19 DIAGNOSIS — E1143 Type 2 diabetes mellitus with diabetic autonomic (poly)neuropathy: Secondary | ICD-10-CM | POA: Diagnosis not present

## 2022-10-19 DIAGNOSIS — L97422 Non-pressure chronic ulcer of left heel and midfoot with fat layer exposed: Secondary | ICD-10-CM | POA: Diagnosis not present

## 2022-10-19 DIAGNOSIS — Z89432 Acquired absence of left foot: Secondary | ICD-10-CM | POA: Diagnosis not present

## 2022-10-25 DIAGNOSIS — E11621 Type 2 diabetes mellitus with foot ulcer: Secondary | ICD-10-CM | POA: Diagnosis not present

## 2022-10-25 DIAGNOSIS — L97425 Non-pressure chronic ulcer of left heel and midfoot with muscle involvement without evidence of necrosis: Secondary | ICD-10-CM | POA: Diagnosis not present

## 2022-10-26 DIAGNOSIS — L97509 Non-pressure chronic ulcer of other part of unspecified foot with unspecified severity: Secondary | ICD-10-CM | POA: Diagnosis not present

## 2022-10-26 DIAGNOSIS — I10 Essential (primary) hypertension: Secondary | ICD-10-CM | POA: Diagnosis not present

## 2022-10-26 DIAGNOSIS — L97529 Non-pressure chronic ulcer of other part of left foot with unspecified severity: Secondary | ICD-10-CM | POA: Diagnosis not present

## 2022-10-26 DIAGNOSIS — E11621 Type 2 diabetes mellitus with foot ulcer: Secondary | ICD-10-CM | POA: Diagnosis not present

## 2022-10-26 DIAGNOSIS — N183 Chronic kidney disease, stage 3 unspecified: Secondary | ICD-10-CM | POA: Diagnosis not present

## 2022-10-26 DIAGNOSIS — K219 Gastro-esophageal reflux disease without esophagitis: Secondary | ICD-10-CM | POA: Diagnosis not present

## 2022-10-26 DIAGNOSIS — N1831 Chronic kidney disease, stage 3a: Secondary | ICD-10-CM | POA: Diagnosis not present

## 2022-10-26 DIAGNOSIS — E1143 Type 2 diabetes mellitus with diabetic autonomic (poly)neuropathy: Secondary | ICD-10-CM | POA: Diagnosis not present

## 2022-10-26 DIAGNOSIS — L97429 Non-pressure chronic ulcer of left heel and midfoot with unspecified severity: Secondary | ICD-10-CM | POA: Diagnosis not present

## 2022-10-26 DIAGNOSIS — I129 Hypertensive chronic kidney disease with stage 1 through stage 4 chronic kidney disease, or unspecified chronic kidney disease: Secondary | ICD-10-CM | POA: Diagnosis not present

## 2022-10-26 DIAGNOSIS — E09621 Drug or chemical induced diabetes mellitus with foot ulcer: Secondary | ICD-10-CM | POA: Diagnosis not present

## 2022-10-26 DIAGNOSIS — E669 Obesity, unspecified: Secondary | ICD-10-CM | POA: Diagnosis not present

## 2022-10-27 DIAGNOSIS — L97429 Non-pressure chronic ulcer of left heel and midfoot with unspecified severity: Secondary | ICD-10-CM | POA: Diagnosis not present

## 2022-10-27 DIAGNOSIS — E669 Obesity, unspecified: Secondary | ICD-10-CM | POA: Diagnosis not present

## 2022-10-27 DIAGNOSIS — M14672 Charcot's joint, left ankle and foot: Secondary | ICD-10-CM | POA: Diagnosis not present

## 2022-10-27 DIAGNOSIS — L97529 Non-pressure chronic ulcer of other part of left foot with unspecified severity: Secondary | ICD-10-CM | POA: Diagnosis not present

## 2022-10-27 DIAGNOSIS — R262 Difficulty in walking, not elsewhere classified: Secondary | ICD-10-CM | POA: Diagnosis not present

## 2022-10-27 DIAGNOSIS — M62572 Muscle wasting and atrophy, not elsewhere classified, left ankle and foot: Secondary | ICD-10-CM | POA: Diagnosis not present

## 2022-10-27 DIAGNOSIS — I10 Essential (primary) hypertension: Secondary | ICD-10-CM | POA: Diagnosis not present

## 2022-10-27 DIAGNOSIS — N1831 Chronic kidney disease, stage 3a: Secondary | ICD-10-CM | POA: Diagnosis not present

## 2022-10-27 DIAGNOSIS — N183 Chronic kidney disease, stage 3 unspecified: Secondary | ICD-10-CM | POA: Diagnosis not present

## 2022-10-27 DIAGNOSIS — E1122 Type 2 diabetes mellitus with diabetic chronic kidney disease: Secondary | ICD-10-CM | POA: Diagnosis not present

## 2022-10-27 DIAGNOSIS — K219 Gastro-esophageal reflux disease without esophagitis: Secondary | ICD-10-CM | POA: Diagnosis not present

## 2022-10-27 DIAGNOSIS — I779 Disorder of arteries and arterioles, unspecified: Secondary | ICD-10-CM | POA: Diagnosis not present

## 2022-10-27 DIAGNOSIS — Z7409 Other reduced mobility: Secondary | ICD-10-CM | POA: Diagnosis not present

## 2022-10-27 DIAGNOSIS — E11621 Type 2 diabetes mellitus with foot ulcer: Secondary | ICD-10-CM | POA: Diagnosis not present

## 2022-10-27 DIAGNOSIS — E09621 Drug or chemical induced diabetes mellitus with foot ulcer: Secondary | ICD-10-CM | POA: Diagnosis not present

## 2022-10-27 DIAGNOSIS — Z6838 Body mass index (BMI) 38.0-38.9, adult: Secondary | ICD-10-CM | POA: Diagnosis not present

## 2022-10-27 DIAGNOSIS — M6281 Muscle weakness (generalized): Secondary | ICD-10-CM | POA: Diagnosis not present

## 2022-10-27 DIAGNOSIS — R278 Other lack of coordination: Secondary | ICD-10-CM | POA: Diagnosis not present

## 2022-10-27 DIAGNOSIS — R488 Other symbolic dysfunctions: Secondary | ICD-10-CM | POA: Diagnosis not present

## 2022-10-27 DIAGNOSIS — M25475 Effusion, left foot: Secondary | ICD-10-CM | POA: Diagnosis not present

## 2022-10-27 DIAGNOSIS — L97509 Non-pressure chronic ulcer of other part of unspecified foot with unspecified severity: Secondary | ICD-10-CM | POA: Diagnosis not present

## 2022-10-27 DIAGNOSIS — L97422 Non-pressure chronic ulcer of left heel and midfoot with fat layer exposed: Secondary | ICD-10-CM | POA: Diagnosis not present

## 2022-10-27 DIAGNOSIS — F32A Depression, unspecified: Secondary | ICD-10-CM | POA: Diagnosis not present

## 2022-10-27 DIAGNOSIS — Z79899 Other long term (current) drug therapy: Secondary | ICD-10-CM | POA: Diagnosis not present

## 2022-10-27 DIAGNOSIS — Z48817 Encounter for surgical aftercare following surgery on the skin and subcutaneous tissue: Secondary | ICD-10-CM | POA: Diagnosis not present

## 2022-10-27 DIAGNOSIS — E1165 Type 2 diabetes mellitus with hyperglycemia: Secondary | ICD-10-CM | POA: Diagnosis not present

## 2022-10-27 DIAGNOSIS — L03116 Cellulitis of left lower limb: Secondary | ICD-10-CM | POA: Diagnosis not present

## 2022-10-27 DIAGNOSIS — E10621 Type 1 diabetes mellitus with foot ulcer: Secondary | ICD-10-CM | POA: Diagnosis not present

## 2022-10-27 DIAGNOSIS — E1143 Type 2 diabetes mellitus with diabetic autonomic (poly)neuropathy: Secondary | ICD-10-CM | POA: Diagnosis not present

## 2022-10-27 DIAGNOSIS — E1149 Type 2 diabetes mellitus with other diabetic neurological complication: Secondary | ICD-10-CM | POA: Diagnosis not present

## 2022-10-27 DIAGNOSIS — Z794 Long term (current) use of insulin: Secondary | ICD-10-CM | POA: Diagnosis not present

## 2022-10-27 DIAGNOSIS — E860 Dehydration: Secondary | ICD-10-CM | POA: Diagnosis not present

## 2022-10-27 DIAGNOSIS — E785 Hyperlipidemia, unspecified: Secondary | ICD-10-CM | POA: Diagnosis not present

## 2022-10-27 DIAGNOSIS — I129 Hypertensive chronic kidney disease with stage 1 through stage 4 chronic kidney disease, or unspecified chronic kidney disease: Secondary | ICD-10-CM | POA: Diagnosis not present

## 2022-10-27 DIAGNOSIS — M109 Gout, unspecified: Secondary | ICD-10-CM | POA: Diagnosis not present

## 2022-10-28 DIAGNOSIS — L97429 Non-pressure chronic ulcer of left heel and midfoot with unspecified severity: Secondary | ICD-10-CM | POA: Diagnosis not present

## 2022-10-28 DIAGNOSIS — L97529 Non-pressure chronic ulcer of other part of left foot with unspecified severity: Secondary | ICD-10-CM | POA: Diagnosis not present

## 2022-10-28 DIAGNOSIS — M62572 Muscle wasting and atrophy, not elsewhere classified, left ankle and foot: Secondary | ICD-10-CM | POA: Diagnosis not present

## 2022-10-28 DIAGNOSIS — E11621 Type 2 diabetes mellitus with foot ulcer: Secondary | ICD-10-CM | POA: Diagnosis not present

## 2022-10-28 DIAGNOSIS — M25475 Effusion, left foot: Secondary | ICD-10-CM | POA: Diagnosis not present

## 2022-10-28 DIAGNOSIS — N1831 Chronic kidney disease, stage 3a: Secondary | ICD-10-CM | POA: Diagnosis not present

## 2022-10-28 DIAGNOSIS — I10 Essential (primary) hypertension: Secondary | ICD-10-CM | POA: Diagnosis not present

## 2022-10-28 DIAGNOSIS — L03116 Cellulitis of left lower limb: Secondary | ICD-10-CM | POA: Diagnosis not present

## 2022-10-29 DIAGNOSIS — E11621 Type 2 diabetes mellitus with foot ulcer: Secondary | ICD-10-CM | POA: Diagnosis not present

## 2022-10-29 DIAGNOSIS — L97429 Non-pressure chronic ulcer of left heel and midfoot with unspecified severity: Secondary | ICD-10-CM | POA: Diagnosis not present

## 2022-10-29 DIAGNOSIS — I10 Essential (primary) hypertension: Secondary | ICD-10-CM | POA: Diagnosis not present

## 2022-10-29 DIAGNOSIS — N1831 Chronic kidney disease, stage 3a: Secondary | ICD-10-CM | POA: Diagnosis not present

## 2022-10-30 DIAGNOSIS — L97509 Non-pressure chronic ulcer of other part of unspecified foot with unspecified severity: Secondary | ICD-10-CM | POA: Diagnosis not present

## 2022-10-30 DIAGNOSIS — I129 Hypertensive chronic kidney disease with stage 1 through stage 4 chronic kidney disease, or unspecified chronic kidney disease: Secondary | ICD-10-CM | POA: Diagnosis not present

## 2022-10-30 DIAGNOSIS — L97429 Non-pressure chronic ulcer of left heel and midfoot with unspecified severity: Secondary | ICD-10-CM | POA: Diagnosis not present

## 2022-10-30 DIAGNOSIS — N1831 Chronic kidney disease, stage 3a: Secondary | ICD-10-CM | POA: Diagnosis not present

## 2022-10-30 DIAGNOSIS — E09621 Drug or chemical induced diabetes mellitus with foot ulcer: Secondary | ICD-10-CM | POA: Diagnosis not present

## 2022-10-30 DIAGNOSIS — E1122 Type 2 diabetes mellitus with diabetic chronic kidney disease: Secondary | ICD-10-CM | POA: Diagnosis not present

## 2022-10-30 DIAGNOSIS — L97529 Non-pressure chronic ulcer of other part of left foot with unspecified severity: Secondary | ICD-10-CM | POA: Diagnosis not present

## 2022-10-30 DIAGNOSIS — N183 Chronic kidney disease, stage 3 unspecified: Secondary | ICD-10-CM | POA: Diagnosis not present

## 2022-10-30 DIAGNOSIS — K219 Gastro-esophageal reflux disease without esophagitis: Secondary | ICD-10-CM | POA: Diagnosis not present

## 2022-10-30 DIAGNOSIS — I10 Essential (primary) hypertension: Secondary | ICD-10-CM | POA: Diagnosis not present

## 2022-10-30 DIAGNOSIS — E1143 Type 2 diabetes mellitus with diabetic autonomic (poly)neuropathy: Secondary | ICD-10-CM | POA: Diagnosis not present

## 2022-10-30 DIAGNOSIS — E11621 Type 2 diabetes mellitus with foot ulcer: Secondary | ICD-10-CM | POA: Diagnosis not present

## 2022-10-31 DIAGNOSIS — L97429 Non-pressure chronic ulcer of left heel and midfoot with unspecified severity: Secondary | ICD-10-CM | POA: Diagnosis not present

## 2022-10-31 DIAGNOSIS — E11621 Type 2 diabetes mellitus with foot ulcer: Secondary | ICD-10-CM | POA: Diagnosis not present

## 2022-10-31 DIAGNOSIS — N1831 Chronic kidney disease, stage 3a: Secondary | ICD-10-CM | POA: Diagnosis not present

## 2022-10-31 DIAGNOSIS — I10 Essential (primary) hypertension: Secondary | ICD-10-CM | POA: Diagnosis not present

## 2022-11-01 DIAGNOSIS — I10 Essential (primary) hypertension: Secondary | ICD-10-CM | POA: Diagnosis not present

## 2022-11-01 DIAGNOSIS — E11621 Type 2 diabetes mellitus with foot ulcer: Secondary | ICD-10-CM | POA: Diagnosis not present

## 2022-11-01 DIAGNOSIS — M14672 Charcot's joint, left ankle and foot: Secondary | ICD-10-CM | POA: Diagnosis not present

## 2022-11-01 DIAGNOSIS — N1831 Chronic kidney disease, stage 3a: Secondary | ICD-10-CM | POA: Diagnosis not present

## 2022-11-01 DIAGNOSIS — L97429 Non-pressure chronic ulcer of left heel and midfoot with unspecified severity: Secondary | ICD-10-CM | POA: Diagnosis not present

## 2022-11-01 DIAGNOSIS — E1165 Type 2 diabetes mellitus with hyperglycemia: Secondary | ICD-10-CM | POA: Diagnosis not present

## 2022-11-02 DIAGNOSIS — E1165 Type 2 diabetes mellitus with hyperglycemia: Secondary | ICD-10-CM | POA: Diagnosis not present

## 2022-11-02 DIAGNOSIS — I10 Essential (primary) hypertension: Secondary | ICD-10-CM | POA: Diagnosis not present

## 2022-11-02 DIAGNOSIS — E11621 Type 2 diabetes mellitus with foot ulcer: Secondary | ICD-10-CM | POA: Diagnosis not present

## 2022-11-02 DIAGNOSIS — N1831 Chronic kidney disease, stage 3a: Secondary | ICD-10-CM | POA: Diagnosis not present

## 2022-11-02 DIAGNOSIS — L97429 Non-pressure chronic ulcer of left heel and midfoot with unspecified severity: Secondary | ICD-10-CM | POA: Diagnosis not present

## 2022-11-03 DIAGNOSIS — E11621 Type 2 diabetes mellitus with foot ulcer: Secondary | ICD-10-CM | POA: Diagnosis not present

## 2022-11-03 DIAGNOSIS — N1831 Chronic kidney disease, stage 3a: Secondary | ICD-10-CM | POA: Diagnosis not present

## 2022-11-03 DIAGNOSIS — L97429 Non-pressure chronic ulcer of left heel and midfoot with unspecified severity: Secondary | ICD-10-CM | POA: Diagnosis not present

## 2022-11-03 DIAGNOSIS — I10 Essential (primary) hypertension: Secondary | ICD-10-CM | POA: Diagnosis not present

## 2022-11-03 DIAGNOSIS — E1165 Type 2 diabetes mellitus with hyperglycemia: Secondary | ICD-10-CM | POA: Diagnosis not present

## 2022-11-04 DIAGNOSIS — E1165 Type 2 diabetes mellitus with hyperglycemia: Secondary | ICD-10-CM | POA: Diagnosis not present

## 2022-11-04 DIAGNOSIS — I10 Essential (primary) hypertension: Secondary | ICD-10-CM | POA: Diagnosis not present

## 2022-11-04 DIAGNOSIS — N1831 Chronic kidney disease, stage 3a: Secondary | ICD-10-CM | POA: Diagnosis not present

## 2022-11-04 DIAGNOSIS — L97429 Non-pressure chronic ulcer of left heel and midfoot with unspecified severity: Secondary | ICD-10-CM | POA: Diagnosis not present

## 2022-11-04 DIAGNOSIS — E11621 Type 2 diabetes mellitus with foot ulcer: Secondary | ICD-10-CM | POA: Diagnosis not present

## 2022-11-05 DIAGNOSIS — E11621 Type 2 diabetes mellitus with foot ulcer: Secondary | ICD-10-CM | POA: Diagnosis not present

## 2022-11-05 DIAGNOSIS — L97429 Non-pressure chronic ulcer of left heel and midfoot with unspecified severity: Secondary | ICD-10-CM | POA: Diagnosis not present

## 2022-11-05 DIAGNOSIS — N1831 Chronic kidney disease, stage 3a: Secondary | ICD-10-CM | POA: Diagnosis not present

## 2022-11-05 DIAGNOSIS — E1165 Type 2 diabetes mellitus with hyperglycemia: Secondary | ICD-10-CM | POA: Diagnosis not present

## 2022-11-05 DIAGNOSIS — I10 Essential (primary) hypertension: Secondary | ICD-10-CM | POA: Diagnosis not present

## 2022-11-06 DIAGNOSIS — E1165 Type 2 diabetes mellitus with hyperglycemia: Secondary | ICD-10-CM | POA: Diagnosis not present

## 2022-11-06 DIAGNOSIS — N1831 Chronic kidney disease, stage 3a: Secondary | ICD-10-CM | POA: Diagnosis not present

## 2022-11-06 DIAGNOSIS — E11621 Type 2 diabetes mellitus with foot ulcer: Secondary | ICD-10-CM | POA: Diagnosis not present

## 2022-11-06 DIAGNOSIS — L97429 Non-pressure chronic ulcer of left heel and midfoot with unspecified severity: Secondary | ICD-10-CM | POA: Diagnosis not present

## 2022-11-06 DIAGNOSIS — I10 Essential (primary) hypertension: Secondary | ICD-10-CM | POA: Diagnosis not present

## 2022-11-07 DIAGNOSIS — E1165 Type 2 diabetes mellitus with hyperglycemia: Secondary | ICD-10-CM | POA: Diagnosis not present

## 2022-11-07 DIAGNOSIS — L97429 Non-pressure chronic ulcer of left heel and midfoot with unspecified severity: Secondary | ICD-10-CM | POA: Diagnosis not present

## 2022-11-07 DIAGNOSIS — N1831 Chronic kidney disease, stage 3a: Secondary | ICD-10-CM | POA: Diagnosis not present

## 2022-11-07 DIAGNOSIS — E11621 Type 2 diabetes mellitus with foot ulcer: Secondary | ICD-10-CM | POA: Diagnosis not present

## 2022-11-07 DIAGNOSIS — I10 Essential (primary) hypertension: Secondary | ICD-10-CM | POA: Diagnosis not present

## 2022-11-08 ENCOUNTER — Telehealth (HOSPITAL_BASED_OUTPATIENT_CLINIC_OR_DEPARTMENT_OTHER): Payer: Self-pay

## 2022-11-08 DIAGNOSIS — L97509 Non-pressure chronic ulcer of other part of unspecified foot with unspecified severity: Secondary | ICD-10-CM | POA: Diagnosis not present

## 2022-11-08 DIAGNOSIS — E11621 Type 2 diabetes mellitus with foot ulcer: Secondary | ICD-10-CM | POA: Diagnosis not present

## 2022-11-08 DIAGNOSIS — M14672 Charcot's joint, left ankle and foot: Secondary | ICD-10-CM | POA: Diagnosis not present

## 2022-11-08 NOTE — Telephone Encounter (Signed)
Patient called to inquire about wound vac changes with wound care center stating that his insurance does not cover home health. He is currently a a patient at [facility]. Patient was informed that we would need to schedule him as a new patient and manage his wound and/or wound vac as part of wound care treatment. He gave me phone number for Surgeon and Case Manager to allow me to call and inquire of status and details. He gave me permission verbally to access his record in Care Everywhere. Mr. Eckerd notes in Care Everywhere indicate a future referral to Wound Care. Recommended to Mr. Augusto Garbe if he wants  [facility] to manage his wound then he should inform his care team in preparation for discharge.

## 2022-11-09 DIAGNOSIS — L97509 Non-pressure chronic ulcer of other part of unspecified foot with unspecified severity: Secondary | ICD-10-CM | POA: Diagnosis not present

## 2022-11-09 DIAGNOSIS — E11621 Type 2 diabetes mellitus with foot ulcer: Secondary | ICD-10-CM | POA: Diagnosis not present

## 2022-11-10 DIAGNOSIS — L97509 Non-pressure chronic ulcer of other part of unspecified foot with unspecified severity: Secondary | ICD-10-CM | POA: Diagnosis not present

## 2022-11-10 DIAGNOSIS — E11621 Type 2 diabetes mellitus with foot ulcer: Secondary | ICD-10-CM | POA: Diagnosis not present

## 2022-11-11 DIAGNOSIS — E11621 Type 2 diabetes mellitus with foot ulcer: Secondary | ICD-10-CM | POA: Diagnosis not present

## 2022-11-11 DIAGNOSIS — L97509 Non-pressure chronic ulcer of other part of unspecified foot with unspecified severity: Secondary | ICD-10-CM | POA: Diagnosis not present

## 2022-11-12 DIAGNOSIS — E11621 Type 2 diabetes mellitus with foot ulcer: Secondary | ICD-10-CM | POA: Diagnosis not present

## 2022-11-12 DIAGNOSIS — L97509 Non-pressure chronic ulcer of other part of unspecified foot with unspecified severity: Secondary | ICD-10-CM | POA: Diagnosis not present

## 2022-11-13 DIAGNOSIS — L97509 Non-pressure chronic ulcer of other part of unspecified foot with unspecified severity: Secondary | ICD-10-CM | POA: Diagnosis not present

## 2022-11-13 DIAGNOSIS — E11621 Type 2 diabetes mellitus with foot ulcer: Secondary | ICD-10-CM | POA: Diagnosis not present

## 2022-11-14 DIAGNOSIS — I129 Hypertensive chronic kidney disease with stage 1 through stage 4 chronic kidney disease, or unspecified chronic kidney disease: Secondary | ICD-10-CM | POA: Diagnosis not present

## 2022-11-14 DIAGNOSIS — L97429 Non-pressure chronic ulcer of left heel and midfoot with unspecified severity: Secondary | ICD-10-CM | POA: Diagnosis not present

## 2022-11-14 DIAGNOSIS — E1122 Type 2 diabetes mellitus with diabetic chronic kidney disease: Secondary | ICD-10-CM | POA: Diagnosis not present

## 2022-11-14 DIAGNOSIS — E1143 Type 2 diabetes mellitus with diabetic autonomic (poly)neuropathy: Secondary | ICD-10-CM | POA: Diagnosis not present

## 2022-11-14 DIAGNOSIS — E10621 Type 1 diabetes mellitus with foot ulcer: Secondary | ICD-10-CM | POA: Diagnosis not present

## 2022-11-14 DIAGNOSIS — Z48817 Encounter for surgical aftercare following surgery on the skin and subcutaneous tissue: Secondary | ICD-10-CM | POA: Diagnosis not present

## 2022-11-14 DIAGNOSIS — D649 Anemia, unspecified: Secondary | ICD-10-CM | POA: Diagnosis not present

## 2022-11-14 DIAGNOSIS — M109 Gout, unspecified: Secondary | ICD-10-CM | POA: Diagnosis not present

## 2022-11-14 DIAGNOSIS — Z7189 Other specified counseling: Secondary | ICD-10-CM | POA: Diagnosis not present

## 2022-11-14 DIAGNOSIS — E11610 Type 2 diabetes mellitus with diabetic neuropathic arthropathy: Secondary | ICD-10-CM | POA: Diagnosis not present

## 2022-11-14 DIAGNOSIS — N1831 Chronic kidney disease, stage 3a: Secondary | ICD-10-CM | POA: Diagnosis not present

## 2022-11-14 DIAGNOSIS — M14672 Charcot's joint, left ankle and foot: Secondary | ICD-10-CM | POA: Diagnosis not present

## 2022-11-14 DIAGNOSIS — E09621 Drug or chemical induced diabetes mellitus with foot ulcer: Secondary | ICD-10-CM | POA: Diagnosis not present

## 2022-11-14 DIAGNOSIS — N183 Chronic kidney disease, stage 3 unspecified: Secondary | ICD-10-CM | POA: Diagnosis not present

## 2022-11-14 DIAGNOSIS — E11621 Type 2 diabetes mellitus with foot ulcer: Secondary | ICD-10-CM | POA: Diagnosis not present

## 2022-11-14 DIAGNOSIS — Z6838 Body mass index (BMI) 38.0-38.9, adult: Secondary | ICD-10-CM | POA: Diagnosis not present

## 2022-11-14 DIAGNOSIS — Z794 Long term (current) use of insulin: Secondary | ICD-10-CM | POA: Diagnosis not present

## 2022-11-14 DIAGNOSIS — E11628 Type 2 diabetes mellitus with other skin complications: Secondary | ICD-10-CM | POA: Diagnosis not present

## 2022-11-14 DIAGNOSIS — E569 Vitamin deficiency, unspecified: Secondary | ICD-10-CM | POA: Diagnosis not present

## 2022-11-14 DIAGNOSIS — I739 Peripheral vascular disease, unspecified: Secondary | ICD-10-CM | POA: Diagnosis not present

## 2022-11-14 DIAGNOSIS — E785 Hyperlipidemia, unspecified: Secondary | ICD-10-CM | POA: Diagnosis not present

## 2022-11-14 DIAGNOSIS — L97422 Non-pressure chronic ulcer of left heel and midfoot with fat layer exposed: Secondary | ICD-10-CM | POA: Diagnosis not present

## 2022-11-14 DIAGNOSIS — Z79899 Other long term (current) drug therapy: Secondary | ICD-10-CM | POA: Diagnosis not present

## 2022-11-14 DIAGNOSIS — I10 Essential (primary) hypertension: Secondary | ICD-10-CM | POA: Diagnosis not present

## 2022-11-14 DIAGNOSIS — L089 Local infection of the skin and subcutaneous tissue, unspecified: Secondary | ICD-10-CM | POA: Diagnosis not present

## 2022-11-14 DIAGNOSIS — R488 Other symbolic dysfunctions: Secondary | ICD-10-CM | POA: Diagnosis not present

## 2022-11-14 DIAGNOSIS — E1149 Type 2 diabetes mellitus with other diabetic neurological complication: Secondary | ICD-10-CM | POA: Diagnosis not present

## 2022-11-14 DIAGNOSIS — R5381 Other malaise: Secondary | ICD-10-CM | POA: Diagnosis not present

## 2022-11-14 DIAGNOSIS — R262 Difficulty in walking, not elsewhere classified: Secondary | ICD-10-CM | POA: Diagnosis not present

## 2022-11-14 DIAGNOSIS — E789 Disorder of lipoprotein metabolism, unspecified: Secondary | ICD-10-CM | POA: Diagnosis not present

## 2022-11-14 DIAGNOSIS — Z7409 Other reduced mobility: Secondary | ICD-10-CM | POA: Diagnosis not present

## 2022-11-14 DIAGNOSIS — E669 Obesity, unspecified: Secondary | ICD-10-CM | POA: Diagnosis not present

## 2022-11-14 DIAGNOSIS — M6281 Muscle weakness (generalized): Secondary | ICD-10-CM | POA: Diagnosis not present

## 2022-11-14 DIAGNOSIS — R278 Other lack of coordination: Secondary | ICD-10-CM | POA: Diagnosis not present

## 2022-11-14 DIAGNOSIS — R52 Pain, unspecified: Secondary | ICD-10-CM | POA: Diagnosis not present

## 2022-11-14 DIAGNOSIS — L97509 Non-pressure chronic ulcer of other part of unspecified foot with unspecified severity: Secondary | ICD-10-CM | POA: Diagnosis not present

## 2022-11-14 DIAGNOSIS — K219 Gastro-esophageal reflux disease without esophagitis: Secondary | ICD-10-CM | POA: Diagnosis not present

## 2022-11-14 DIAGNOSIS — E559 Vitamin D deficiency, unspecified: Secondary | ICD-10-CM | POA: Diagnosis not present

## 2022-11-14 DIAGNOSIS — S91302S Unspecified open wound, left foot, sequela: Secondary | ICD-10-CM | POA: Diagnosis not present

## 2022-11-14 DIAGNOSIS — E119 Type 2 diabetes mellitus without complications: Secondary | ICD-10-CM | POA: Diagnosis not present

## 2022-11-14 DIAGNOSIS — F4323 Adjustment disorder with mixed anxiety and depressed mood: Secondary | ICD-10-CM | POA: Diagnosis not present

## 2022-11-14 DIAGNOSIS — I779 Disorder of arteries and arterioles, unspecified: Secondary | ICD-10-CM | POA: Diagnosis not present

## 2022-11-14 DIAGNOSIS — F32A Depression, unspecified: Secondary | ICD-10-CM | POA: Diagnosis not present

## 2022-11-15 DIAGNOSIS — E559 Vitamin D deficiency, unspecified: Secondary | ICD-10-CM | POA: Diagnosis not present

## 2022-11-15 DIAGNOSIS — I739 Peripheral vascular disease, unspecified: Secondary | ICD-10-CM | POA: Diagnosis not present

## 2022-11-15 DIAGNOSIS — K219 Gastro-esophageal reflux disease without esophagitis: Secondary | ICD-10-CM | POA: Diagnosis not present

## 2022-11-15 DIAGNOSIS — Z7189 Other specified counseling: Secondary | ICD-10-CM | POA: Diagnosis not present

## 2022-11-15 DIAGNOSIS — M109 Gout, unspecified: Secondary | ICD-10-CM | POA: Diagnosis not present

## 2022-11-15 DIAGNOSIS — E1122 Type 2 diabetes mellitus with diabetic chronic kidney disease: Secondary | ICD-10-CM | POA: Diagnosis not present

## 2022-11-15 DIAGNOSIS — E785 Hyperlipidemia, unspecified: Secondary | ICD-10-CM | POA: Diagnosis not present

## 2022-11-15 DIAGNOSIS — I10 Essential (primary) hypertension: Secondary | ICD-10-CM | POA: Diagnosis not present

## 2022-11-17 DIAGNOSIS — Z794 Long term (current) use of insulin: Secondary | ICD-10-CM | POA: Diagnosis not present

## 2022-11-17 DIAGNOSIS — F32A Depression, unspecified: Secondary | ICD-10-CM | POA: Diagnosis not present

## 2022-11-17 DIAGNOSIS — E785 Hyperlipidemia, unspecified: Secondary | ICD-10-CM | POA: Diagnosis not present

## 2022-11-17 DIAGNOSIS — N1831 Chronic kidney disease, stage 3a: Secondary | ICD-10-CM | POA: Diagnosis not present

## 2022-11-17 DIAGNOSIS — E1122 Type 2 diabetes mellitus with diabetic chronic kidney disease: Secondary | ICD-10-CM | POA: Diagnosis not present

## 2022-11-17 DIAGNOSIS — Z79899 Other long term (current) drug therapy: Secondary | ICD-10-CM | POA: Diagnosis not present

## 2022-11-17 DIAGNOSIS — I10 Essential (primary) hypertension: Secondary | ICD-10-CM | POA: Diagnosis not present

## 2022-11-17 DIAGNOSIS — R52 Pain, unspecified: Secondary | ICD-10-CM | POA: Diagnosis not present

## 2022-11-21 DIAGNOSIS — E569 Vitamin deficiency, unspecified: Secondary | ICD-10-CM | POA: Diagnosis not present

## 2022-11-21 DIAGNOSIS — R52 Pain, unspecified: Secondary | ICD-10-CM | POA: Diagnosis not present

## 2022-11-21 DIAGNOSIS — Z79899 Other long term (current) drug therapy: Secondary | ICD-10-CM | POA: Diagnosis not present

## 2022-11-21 DIAGNOSIS — E1122 Type 2 diabetes mellitus with diabetic chronic kidney disease: Secondary | ICD-10-CM | POA: Diagnosis not present

## 2022-11-22 DIAGNOSIS — I739 Peripheral vascular disease, unspecified: Secondary | ICD-10-CM | POA: Diagnosis not present

## 2022-11-22 DIAGNOSIS — I10 Essential (primary) hypertension: Secondary | ICD-10-CM | POA: Diagnosis not present

## 2022-11-22 DIAGNOSIS — L97509 Non-pressure chronic ulcer of other part of unspecified foot with unspecified severity: Secondary | ICD-10-CM | POA: Diagnosis not present

## 2022-11-22 DIAGNOSIS — E11610 Type 2 diabetes mellitus with diabetic neuropathic arthropathy: Secondary | ICD-10-CM | POA: Diagnosis not present

## 2022-11-22 DIAGNOSIS — E11621 Type 2 diabetes mellitus with foot ulcer: Secondary | ICD-10-CM | POA: Diagnosis not present

## 2022-11-22 DIAGNOSIS — E1122 Type 2 diabetes mellitus with diabetic chronic kidney disease: Secondary | ICD-10-CM | POA: Diagnosis not present

## 2022-11-29 DIAGNOSIS — E1122 Type 2 diabetes mellitus with diabetic chronic kidney disease: Secondary | ICD-10-CM | POA: Diagnosis not present

## 2022-11-29 DIAGNOSIS — N1831 Chronic kidney disease, stage 3a: Secondary | ICD-10-CM | POA: Diagnosis not present

## 2022-11-29 DIAGNOSIS — I739 Peripheral vascular disease, unspecified: Secondary | ICD-10-CM | POA: Diagnosis not present

## 2022-11-29 DIAGNOSIS — R5381 Other malaise: Secondary | ICD-10-CM | POA: Diagnosis not present

## 2022-11-29 DIAGNOSIS — I10 Essential (primary) hypertension: Secondary | ICD-10-CM | POA: Diagnosis not present

## 2022-11-29 DIAGNOSIS — L97509 Non-pressure chronic ulcer of other part of unspecified foot with unspecified severity: Secondary | ICD-10-CM | POA: Diagnosis not present

## 2022-12-05 DIAGNOSIS — I10 Essential (primary) hypertension: Secondary | ICD-10-CM | POA: Diagnosis not present

## 2022-12-05 DIAGNOSIS — E1122 Type 2 diabetes mellitus with diabetic chronic kidney disease: Secondary | ICD-10-CM | POA: Diagnosis not present

## 2022-12-05 DIAGNOSIS — F32A Depression, unspecified: Secondary | ICD-10-CM | POA: Diagnosis not present

## 2022-12-06 DIAGNOSIS — E11621 Type 2 diabetes mellitus with foot ulcer: Secondary | ICD-10-CM | POA: Diagnosis not present

## 2022-12-06 DIAGNOSIS — E10621 Type 1 diabetes mellitus with foot ulcer: Secondary | ICD-10-CM | POA: Diagnosis not present

## 2022-12-06 DIAGNOSIS — I10 Essential (primary) hypertension: Secondary | ICD-10-CM | POA: Diagnosis not present

## 2022-12-06 DIAGNOSIS — L97422 Non-pressure chronic ulcer of left heel and midfoot with fat layer exposed: Secondary | ICD-10-CM | POA: Diagnosis not present

## 2022-12-06 DIAGNOSIS — E11610 Type 2 diabetes mellitus with diabetic neuropathic arthropathy: Secondary | ICD-10-CM | POA: Diagnosis not present

## 2022-12-06 DIAGNOSIS — L97509 Non-pressure chronic ulcer of other part of unspecified foot with unspecified severity: Secondary | ICD-10-CM | POA: Diagnosis not present

## 2022-12-06 DIAGNOSIS — N1831 Chronic kidney disease, stage 3a: Secondary | ICD-10-CM | POA: Diagnosis not present

## 2022-12-06 DIAGNOSIS — I739 Peripheral vascular disease, unspecified: Secondary | ICD-10-CM | POA: Diagnosis not present

## 2022-12-07 DIAGNOSIS — L97509 Non-pressure chronic ulcer of other part of unspecified foot with unspecified severity: Secondary | ICD-10-CM | POA: Diagnosis not present

## 2022-12-07 DIAGNOSIS — I10 Essential (primary) hypertension: Secondary | ICD-10-CM | POA: Diagnosis not present

## 2022-12-07 DIAGNOSIS — N1831 Chronic kidney disease, stage 3a: Secondary | ICD-10-CM | POA: Diagnosis not present

## 2022-12-09 DIAGNOSIS — E11621 Type 2 diabetes mellitus with foot ulcer: Secondary | ICD-10-CM | POA: Diagnosis not present

## 2022-12-09 DIAGNOSIS — I129 Hypertensive chronic kidney disease with stage 1 through stage 4 chronic kidney disease, or unspecified chronic kidney disease: Secondary | ICD-10-CM | POA: Diagnosis not present

## 2022-12-09 DIAGNOSIS — E1122 Type 2 diabetes mellitus with diabetic chronic kidney disease: Secondary | ICD-10-CM | POA: Diagnosis not present

## 2022-12-09 DIAGNOSIS — L97429 Non-pressure chronic ulcer of left heel and midfoot with unspecified severity: Secondary | ICD-10-CM | POA: Diagnosis not present

## 2022-12-09 DIAGNOSIS — Z945 Skin transplant status: Secondary | ICD-10-CM | POA: Diagnosis not present

## 2022-12-09 DIAGNOSIS — L97422 Non-pressure chronic ulcer of left heel and midfoot with fat layer exposed: Secondary | ICD-10-CM | POA: Diagnosis not present

## 2022-12-09 DIAGNOSIS — E669 Obesity, unspecified: Secondary | ICD-10-CM | POA: Diagnosis not present

## 2022-12-09 DIAGNOSIS — Z794 Long term (current) use of insulin: Secondary | ICD-10-CM | POA: Diagnosis not present

## 2022-12-09 DIAGNOSIS — E1143 Type 2 diabetes mellitus with diabetic autonomic (poly)neuropathy: Secondary | ICD-10-CM | POA: Diagnosis not present

## 2022-12-09 DIAGNOSIS — Z6838 Body mass index (BMI) 38.0-38.9, adult: Secondary | ICD-10-CM | POA: Diagnosis not present

## 2022-12-09 DIAGNOSIS — N183 Chronic kidney disease, stage 3 unspecified: Secondary | ICD-10-CM | POA: Diagnosis not present

## 2022-12-09 DIAGNOSIS — L089 Local infection of the skin and subcutaneous tissue, unspecified: Secondary | ICD-10-CM | POA: Diagnosis not present

## 2022-12-09 DIAGNOSIS — M109 Gout, unspecified: Secondary | ICD-10-CM | POA: Diagnosis not present

## 2022-12-09 DIAGNOSIS — E11628 Type 2 diabetes mellitus with other skin complications: Secondary | ICD-10-CM | POA: Diagnosis not present

## 2022-12-09 DIAGNOSIS — N1831 Chronic kidney disease, stage 3a: Secondary | ICD-10-CM | POA: Diagnosis not present

## 2022-12-09 DIAGNOSIS — K219 Gastro-esophageal reflux disease without esophagitis: Secondary | ICD-10-CM | POA: Diagnosis not present

## 2022-12-09 DIAGNOSIS — I10 Essential (primary) hypertension: Secondary | ICD-10-CM | POA: Diagnosis not present

## 2022-12-09 DIAGNOSIS — S91302S Unspecified open wound, left foot, sequela: Secondary | ICD-10-CM | POA: Diagnosis not present

## 2022-12-09 DIAGNOSIS — E1149 Type 2 diabetes mellitus with other diabetic neurological complication: Secondary | ICD-10-CM | POA: Diagnosis not present

## 2022-12-09 DIAGNOSIS — Z7409 Other reduced mobility: Secondary | ICD-10-CM | POA: Diagnosis not present

## 2022-12-09 DIAGNOSIS — E785 Hyperlipidemia, unspecified: Secondary | ICD-10-CM | POA: Diagnosis not present

## 2022-12-09 DIAGNOSIS — E10621 Type 1 diabetes mellitus with foot ulcer: Secondary | ICD-10-CM | POA: Diagnosis not present

## 2022-12-10 DIAGNOSIS — L97422 Non-pressure chronic ulcer of left heel and midfoot with fat layer exposed: Secondary | ICD-10-CM | POA: Diagnosis not present

## 2022-12-10 DIAGNOSIS — E10621 Type 1 diabetes mellitus with foot ulcer: Secondary | ICD-10-CM | POA: Diagnosis not present

## 2022-12-11 DIAGNOSIS — E10621 Type 1 diabetes mellitus with foot ulcer: Secondary | ICD-10-CM | POA: Diagnosis not present

## 2022-12-11 DIAGNOSIS — L97422 Non-pressure chronic ulcer of left heel and midfoot with fat layer exposed: Secondary | ICD-10-CM | POA: Diagnosis not present

## 2022-12-12 DIAGNOSIS — L97422 Non-pressure chronic ulcer of left heel and midfoot with fat layer exposed: Secondary | ICD-10-CM | POA: Diagnosis not present

## 2022-12-12 DIAGNOSIS — Z945 Skin transplant status: Secondary | ICD-10-CM | POA: Diagnosis not present

## 2022-12-12 DIAGNOSIS — E10621 Type 1 diabetes mellitus with foot ulcer: Secondary | ICD-10-CM | POA: Diagnosis not present

## 2022-12-12 DIAGNOSIS — L97429 Non-pressure chronic ulcer of left heel and midfoot with unspecified severity: Secondary | ICD-10-CM | POA: Diagnosis not present

## 2022-12-12 DIAGNOSIS — E11621 Type 2 diabetes mellitus with foot ulcer: Secondary | ICD-10-CM | POA: Diagnosis not present

## 2022-12-26 DIAGNOSIS — I10 Essential (primary) hypertension: Secondary | ICD-10-CM | POA: Diagnosis not present

## 2022-12-26 DIAGNOSIS — Z6836 Body mass index (BMI) 36.0-36.9, adult: Secondary | ICD-10-CM | POA: Diagnosis not present

## 2022-12-26 DIAGNOSIS — M109 Gout, unspecified: Secondary | ICD-10-CM | POA: Diagnosis not present

## 2022-12-26 DIAGNOSIS — N183 Chronic kidney disease, stage 3 unspecified: Secondary | ICD-10-CM | POA: Diagnosis not present

## 2022-12-26 DIAGNOSIS — E7800 Pure hypercholesterolemia, unspecified: Secondary | ICD-10-CM | POA: Diagnosis not present

## 2022-12-26 DIAGNOSIS — E11610 Type 2 diabetes mellitus with diabetic neuropathic arthropathy: Secondary | ICD-10-CM | POA: Diagnosis not present

## 2022-12-26 DIAGNOSIS — E1129 Type 2 diabetes mellitus with other diabetic kidney complication: Secondary | ICD-10-CM | POA: Diagnosis not present

## 2022-12-26 DIAGNOSIS — F419 Anxiety disorder, unspecified: Secondary | ICD-10-CM | POA: Diagnosis not present

## 2023-01-09 DIAGNOSIS — M14672 Charcot's joint, left ankle and foot: Secondary | ICD-10-CM | POA: Diagnosis not present

## 2023-01-09 DIAGNOSIS — M25372 Other instability, left ankle: Secondary | ICD-10-CM | POA: Diagnosis not present

## 2023-01-09 DIAGNOSIS — M25371 Other instability, right ankle: Secondary | ICD-10-CM | POA: Diagnosis not present

## 2023-01-09 DIAGNOSIS — Z89412 Acquired absence of left great toe: Secondary | ICD-10-CM | POA: Diagnosis not present

## 2023-01-09 DIAGNOSIS — Z89411 Acquired absence of right great toe: Secondary | ICD-10-CM | POA: Diagnosis not present

## 2023-01-11 DIAGNOSIS — E1143 Type 2 diabetes mellitus with diabetic autonomic (poly)neuropathy: Secondary | ICD-10-CM | POA: Diagnosis not present

## 2023-01-11 DIAGNOSIS — L97422 Non-pressure chronic ulcer of left heel and midfoot with fat layer exposed: Secondary | ICD-10-CM | POA: Diagnosis not present

## 2023-01-11 DIAGNOSIS — E10621 Type 1 diabetes mellitus with foot ulcer: Secondary | ICD-10-CM | POA: Diagnosis not present

## 2023-01-31 DIAGNOSIS — R6889 Other general symptoms and signs: Secondary | ICD-10-CM | POA: Diagnosis not present

## 2023-02-03 DIAGNOSIS — Z6836 Body mass index (BMI) 36.0-36.9, adult: Secondary | ICD-10-CM | POA: Diagnosis not present

## 2023-02-03 DIAGNOSIS — E1129 Type 2 diabetes mellitus with other diabetic kidney complication: Secondary | ICD-10-CM | POA: Diagnosis not present

## 2023-02-03 DIAGNOSIS — R6889 Other general symptoms and signs: Secondary | ICD-10-CM | POA: Diagnosis not present

## 2023-02-10 DIAGNOSIS — R059 Cough, unspecified: Secondary | ICD-10-CM | POA: Diagnosis not present

## 2023-02-10 DIAGNOSIS — Z20822 Contact with and (suspected) exposure to covid-19: Secondary | ICD-10-CM | POA: Diagnosis not present

## 2023-02-10 DIAGNOSIS — R519 Headache, unspecified: Secondary | ICD-10-CM | POA: Diagnosis not present

## 2023-02-10 DIAGNOSIS — R5383 Other fatigue: Secondary | ICD-10-CM | POA: Diagnosis not present

## 2023-02-20 DIAGNOSIS — R6889 Other general symptoms and signs: Secondary | ICD-10-CM | POA: Diagnosis not present

## 2023-02-21 ENCOUNTER — Telehealth: Payer: Self-pay | Admitting: Pharmacist

## 2023-02-21 NOTE — Progress Notes (Signed)
02/21/2023  Patient ID: Fabian November, male   DOB: [birth year], 52 y.o.   MRN: [id number]  [facility] Care Management  [facility]   02/21/2023  AVEDIS CALLO 07-Jul-1970 [id number]  Reason for referral: medication assistance   Referral source: [facility] Medication Assistance List Referral medication(s): [place] Fillers, [place], Farxiga and [place] Current insurance:[employer]   Objective: Allergies  Allergen Reactions   Hydromorphone Itching   Lisinopril Other (See Comments)    Hyperkalemia; stopped by M. Hyman Hopes, MD   Motrin [Ibuprofen] Other (See Comments)    Contraindicated by CKD   Victoza [Liraglutide] Other (See Comments)    Possibly caused renal insufficiency     Medications Reviewed Today     Reviewed by Beecher Mcardle, [facility] (Pharmacist) on 02/21/23 at 1524  Med List Status: <None>   Medication Order Taking? Sig Documenting Provider Last Dose Status Informant  allopurinol (ZYLOPRIM) 100 MG tablet [id number] Yes Take 100 mg by mouth daily. [provider] Taking Active Self  aspirin EC 81 MG tablet [id number] Yes Take 81 mg by mouth daily. [provider] Taking Active Self  Cholecalciferol (VITAMIN D) 125 MCG (5000 UT) CAPS [id number] Yes Take 5,000 Units by mouth daily. [provider] Taking Active   DULoxetine (CYMBALTA) 60 MG capsule [id number] No Take 60 mg by mouth daily.  Patient not taking: Reported on 02/21/2023   [provider] Not Taking Active   Finerenone [facility]) 20 MG TABS [id number] Yes Take 1 tablet by mouth daily. [provider] Taking Active   fluticasone (FLONASE) 50 MCG/ACT nasal spray [id number] No Place 2 sprays into both nostrils daily. [provider] Unknown Active   hydrALAZINE (APRESOLINE) 50 MG tablet [id number] Yes Take 50 mg by mouth 3 (three) times daily. [provider] Taking Active   labetalol (NORMODYNE) 200 MG tablet [id number] Yes Take 200 mg by  mouth 2 (two) times daily. [provider] Taking Active Self  Multiple Vitamin (MULTIVITAMIN) tablet [id number] Yes Take 1 tablet by mouth daily. [provider] Taking Active   OZEMPIC, 1 MG/DOSE, 4 MG/3ML SOPN [id number] Yes Inject 0.5 mg into the skin once a week. [provider] Taking Active   pantoprazole (PROTONIX) 40 MG tablet [id number] No Take 40 mg by mouth daily. [provider] Unknown Active   pioglitazone (ACTOS) 45 MG tablet [id number] No Take 45 mg by mouth daily.  Patient not taking: Reported on 02/21/2023   [provider] Not Taking Active Self  rosuvastatin (CRESTOR) 20 MG tablet [id number] No Take 20 mg by mouth daily. [provider] Unknown Active   [name] FLEXTOUCH 200 UNIT/ML FlexTouch Pen [id number] Yes Inject 40 Units into the skin at bedtime. [provider] Taking Active   vitamin B-12 (CYANOCOBALAMIN) 1000 MCG tablet [id number] Yes Take 1,000 mcg by mouth 2 (two) times daily. [provider] Taking Active Self            Assessment:  Medication Assistance Findings:  Medication assistance needs identified: Spoke with Patient. HIPAA identifiers were obtained.  Patient confirmed he received [place], [place], [place], and [place] through Patient Assistance Programs this year and will need assistance for 2025.  He completed the online attestation for [place] with [place] and me but will need assistance with the other medications. Patient assistance applications will be mailed.  Patient communicated understanding on the necessary documentation.     Additional medication assistance options reviewed with patient as warranted:  No other options identified  Plan: I will route patient assistance letter to [facility] pharmacy technician who will coordinate patient assistance program application process for medications listed above.  [facility] pharmacy technician will assist with obtaining all required documents  from both patient and provider(s) and submit application(s) once completed.     Beecher Mcardle, PharmD, BCACP [facility] Clinical Pharmacist [phone]

## 2023-03-02 DIAGNOSIS — R6889 Other general symptoms and signs: Secondary | ICD-10-CM | POA: Diagnosis not present

## 2023-03-03 DIAGNOSIS — E1122 Type 2 diabetes mellitus with diabetic chronic kidney disease: Secondary | ICD-10-CM | POA: Diagnosis not present

## 2023-03-03 DIAGNOSIS — R6889 Other general symptoms and signs: Secondary | ICD-10-CM | POA: Diagnosis not present

## 2023-03-03 DIAGNOSIS — N183 Chronic kidney disease, stage 3 unspecified: Secondary | ICD-10-CM | POA: Diagnosis not present

## 2023-03-03 DIAGNOSIS — E1129 Type 2 diabetes mellitus with other diabetic kidney complication: Secondary | ICD-10-CM | POA: Diagnosis not present

## 2023-03-03 DIAGNOSIS — Z6836 Body mass index (BMI) 36.0-36.9, adult: Secondary | ICD-10-CM | POA: Diagnosis not present

## 2023-03-07 DIAGNOSIS — R6889 Other general symptoms and signs: Secondary | ICD-10-CM | POA: Diagnosis not present

## 2023-03-22 ENCOUNTER — Telehealth: Payer: Self-pay | Admitting: Pharmacy Technician

## 2023-03-22 DIAGNOSIS — Z5986 Financial insecurity: Secondary | ICD-10-CM

## 2023-03-22 NOTE — Progress Notes (Signed)
Pharmacy Medication Assistance Program Note    03/22/2023  Patient ID: NHIA HEYMANN, male   DOB: 1970/05/05, 52 y.o.   MRN: [id number]     03/22/2023  Outreach Medication One  Initial Outreach Date (Medication One) 03/20/2023  Manufacturer Medication One [employer] [facility] Novolog  Dose of Novolog FlexPen 100 units/ml  Type of Radiographer, therapeutic Assistance  Date Application Sent to Patient 03/23/2023  Application Items Requested Application;Proof of Income;Other  Date Application Sent to Prescriber 03/23/2023  Name of Prescriber Casandra Doffing         03/22/2023  Outreach Medication Two  Initial Outreach Date (Medication Two) 03/20/2023  Manufacturer Medication Two [name]  [employer] Drugs Ozempic  Dose of Ozempic 4mg /34ml  Type of Radiographer, therapeutic Assistance  Date Application Sent to Patient 03/23/2023  Application Items Requested Application;Proof of Income;Other  Date Application Sent to Prescriber 03/23/2023  Name of Prescriber Casandra Doffing        03/22/2023  Outreach Medication Three  Initial Outreach Date (Medication Three) 03/20/2023  Manufacturer Medication Three [name]  Nordisk Drugs Evaristo Bury  Dose of Evaristo Bury FlexTouch 200 units/ml  Type of Radiographer, therapeutic Assistance  Date Application Sent to Patient 03/23/2023  Application Items Requested Application;Proof of Income;Other  Date Application Sent to Prescriber 03/23/2023  Name of Prescriber Casandra Doffing         03/22/2023  Outreach Medication Four  Initial Outreach Date (Medication Four) 03/20/2023  Manufacturer Medication Four Other Manufacturer/Drug  Other Drugs [name] for [name]  Dose of Other Drug 20mg   Type of Assistance Manufacturer Assistance  Date Application Sent to Patient 03/23/2023  Application Items Requested Application;Proof of Income;Other  Date Application Sent to Prescriber 03/23/2023  Name of Prescriber Casandra Doffing        [facility]  Pattricia Boss, [name] [facility]  Office: [phone] Fax: [phone] Email: [email or web address]

## 2023-03-27 DIAGNOSIS — N1831 Chronic kidney disease, stage 3a: Secondary | ICD-10-CM | POA: Diagnosis not present

## 2023-03-27 DIAGNOSIS — R6889 Other general symptoms and signs: Secondary | ICD-10-CM | POA: Diagnosis not present

## 2023-04-03 DIAGNOSIS — L720 Epidermal cyst: Secondary | ICD-10-CM | POA: Diagnosis not present

## 2023-04-20 ENCOUNTER — Telehealth: Payer: Self-pay | Admitting: Pharmacy Technician

## 2023-04-20 DIAGNOSIS — Z5986 Financial insecurity: Secondary | ICD-10-CM

## 2023-04-20 NOTE — Progress Notes (Addendum)
 Pharmacy Medication Assistance Program Note    04/20/2023  Patient ID: [name], male  DOB: [birth year], 53 y.o.  MRN:  [id number]     03/22/2023 04/20/2023  Outreach Medication Four  Initial Outreach Date (Medication Four) 03/20/2023   Manufacturer Medication Four Other Manufacturer/Drug   Other Drugs [name] for [name]   Dose of Other Drug 20mg    Type of Assistance Manufacturer Assistance   Date Application Sent to Patient 03/23/2023   Application Items Requested Application;Proof of Income;Other   Date Application Sent to Prescriber 03/23/2023   Name of Prescriber [name]   Date Application Received From Patient  04/19/2023  Application Items Received From Patient  Application;Proof of Income;Other  Date Application Received From Provider  03/24/2023  Date Application Submitted to Manufacturer  04/19/2023  Method Application Sent to Manufacturer  Fax   Submitted Training and development officer for [name].  The current application with [name] for [name], Novolog and [name] expires on 06/11/2023 and therefore the application will be submitted 30 days before enrollment ends per [name] patient assistance guidelines.  ADDENDUM 04/27/2023 Care coordination call placed to [name] in regard to Kerendia applicaiton. Spoke to [name] who informs they need an updated proof of income. The social security administration benefit verification letter dated 11/04/22 is outdate and they need more up to date information. She informs patients typically receive an awards letter in late December or January detailing what the income will be for the current year.  Will also need that updated income for the [name] application. Successful outreach to patient. HIPAA verified. Patient informs he received a letter from [name] asking for income as well. He informs he  has requested an it trainer from tree surgeon. Provided patient address to mail back the information. Patient informs he is on Ozempic  2mg , takes 34 units of [name] and takes Novolog 3 times a day and at each time he takes, 8 units, 5 units and 8 units respectively.  ADDENDUM 05/11/2023 Received patient's proof of income and submitted to [name] for [name] application.  [name], [name] [name]  Office: [phone] Fax: [phone] Email: [name]

## 2023-04-25 DIAGNOSIS — M14672 Charcot's joint, left ankle and foot: Secondary | ICD-10-CM | POA: Diagnosis not present

## 2023-04-25 DIAGNOSIS — L97422 Non-pressure chronic ulcer of left heel and midfoot with fat layer exposed: Secondary | ICD-10-CM | POA: Diagnosis not present

## 2023-04-25 DIAGNOSIS — Z89432 Acquired absence of left foot: Secondary | ICD-10-CM | POA: Diagnosis not present

## 2023-04-25 DIAGNOSIS — E10621 Type 1 diabetes mellitus with foot ulcer: Secondary | ICD-10-CM | POA: Diagnosis not present

## 2023-04-25 DIAGNOSIS — Z1330 Encounter for screening examination for mental health and behavioral disorders, unspecified: Secondary | ICD-10-CM | POA: Diagnosis not present

## 2023-04-26 DIAGNOSIS — E1165 Type 2 diabetes mellitus with hyperglycemia: Secondary | ICD-10-CM | POA: Diagnosis not present

## 2023-04-26 DIAGNOSIS — E1129 Type 2 diabetes mellitus with other diabetic kidney complication: Secondary | ICD-10-CM | POA: Diagnosis not present

## 2023-04-26 DIAGNOSIS — Z6836 Body mass index (BMI) 36.0-36.9, adult: Secondary | ICD-10-CM | POA: Diagnosis not present

## 2023-05-16 ENCOUNTER — Telehealth: Payer: Self-pay | Admitting: Pharmacy Technician

## 2023-05-16 DIAGNOSIS — Z5986 Financial insecurity: Secondary | ICD-10-CM

## 2023-05-16 NOTE — Progress Notes (Signed)
 Pharmacy Medication Assistance Program Note    05/16/2023  Patient ID: JKWON TREPTOW, male  DOB: Jan 07, 1971, 53 y.o.  MRN:  [id number]     03/22/2023 04/20/2023 05/16/2023  Outreach Medication Four  Initial Outreach Date (Medication Four) 03/20/2023    Manufacturer Medication Four Other Manufacturer/Drug    Other Drugs [name] for [name]    Dose of Other Drug 20mg     Type of Assistance Manufacturer Assistance    Date Application Sent to Patient 03/23/2023    Application Items Requested Application;Proof of Income;Other    Date Application Sent to Prescriber 03/23/2023    Name of Prescriber [name]    Date Application Received From Patient  04/19/2023   Application Items Received From Patient  Application;Proof of Income;Other   Date Application Received From Provider  03/24/2023   Date Application Submitted to Manufacturer  04/19/2023   Method Application Sent to Manufacturer  Fax   Patient Assistance Determination   Approved  Approval Start Date   05/05/2023  Approval End Date   04/10/2024  Patient Notification Method   Telephone Call  Telephone Call Outcome   Successful    Care coordination call placed to [name] in regard to [place] application.  Spoke to Arland who informs patient is APPROVED 05/05/23-04/10/24. She informs initial shipment will go out automatically after someone at [place] speaks with patient to set up delivery. In thise case, one outreach has been made to patient to no avail but other phone call is scheduled 05/18/22. Subsequent refills will need to be phoned into [name] at [phone] when patient has around 7-10 day supply remaining. She also informed that typically [name] will make a courtesy call when a refill is due but if the patient misses that call or needs a refill sooner then she suggests they call into [name] to make the refill request.  Successful outreach to patient. HIPAA verified. Informed patient of the above information and patient verbalized  understanding.  [name], [name] [place]  Office: [phone] Fax: [phone] Email: [email or web address]

## 2023-05-17 ENCOUNTER — Telehealth: Payer: Self-pay | Admitting: Pharmacy Technician

## 2023-05-17 DIAGNOSIS — Z5986 Financial insecurity: Secondary | ICD-10-CM

## 2023-05-17 NOTE — Progress Notes (Signed)
 Pharmacy Medication Assistance Program Note    05/17/2023  Patient ID: [name], male   DOB: 02/02/1971, 53 y.o.   MRN: [id number]     03/22/2023 05/17/2023  Outreach Medication One  Initial Outreach Date (Medication One) 03/20/2023   Manufacturer Medication One [employer] [facility] Novolog   Dose of Novolog FlexPen 100 units/ml   Type of Radiographer, Therapeutic Assistance   Date Application Sent to Patient 03/23/2023   Application Items Requested Application;Proof of Income;Other   Date Application Sent to Prescriber 03/23/2023   Name of Prescriber [name]   Date Application Received From Patient  04/19/2023  Application Items Received From Patient  Application;Proof of Income;Other  Date Application Received From Provider  04/11/2023  Date Application Submitted to Manufacturer  05/17/2023  Method Application Sent to Manufacturer  Fax         03/22/2023 05/17/2023  Outreach Medication Two  Initial Outreach Date (Medication Two) 03/20/2023   Manufacturer Medication Two [name]   [facility] Ozempic   Dose of Ozempic 4mg /57ml   Type of Radiographer, Therapeutic Assistance   Date Application Sent to Patient 03/23/2023   Application Items Requested Application;Proof of Income;Other   Date Application Sent to Prescriber 03/23/2023   Name of Prescriber [name]   Date Application Received From Patient  04/19/2023  Application Items Received From Patient  Application;Proof of Income;Other  Date Application Received From Provider  03/24/2023  Method Application Sent to Manufacturer  Fax  Date Application Submitted to Manufacturer  05/17/2023        03/22/2023 05/17/2023  Outreach Medication Three  Initial Outreach Date (Medication Three) 03/20/2023   Manufacturer Medication Three [name]   [facility] [name]   Dose of [name] FlexTouch 200 units/ml   Type of Radiographer, Therapeutic Assistance   Date Application Sent to Patient 03/23/2023    Application Items Requested Application;Proof of Income;Other   Date Application Sent to Prescriber 03/23/2023   Name of Prescriber [name]   Date Application Received From Patient  04/19/2023  Application Items Received From Patient  Application;Proof of Income;Other  Date Application Received From Provider  03/24/2023  Date Application Submitted to Manufacturer  05/16/2022  Method Application Sent to Manufacturer  Fax    [name], [name] [employer]  Office: [phone] Fax: [phone] Email: [email or web address]

## 2023-05-22 ENCOUNTER — Telehealth: Payer: Self-pay | Admitting: Pharmacy Technician

## 2023-05-22 DIAGNOSIS — Z5986 Financial insecurity: Secondary | ICD-10-CM

## 2023-05-22 NOTE — Progress Notes (Signed)
 Pharmacy Medication Assistance Program Note    05/22/2023  Patient ID: [name], male   DOB: 08-13-1970, 53 y.o.   MRN: [id number]     03/22/2023 05/17/2023 05/22/2023  Outreach Medication One  Initial Outreach Date (Medication One) 03/20/2023    Manufacturer Medication One [employer] [facility] Novolog    Dose of Novolog FlexPen 100 units/ml    Type of Radiographer, therapeutic Assistance    Date Application Sent to Patient 03/23/2023    Application Items Requested Application;Proof of Income;Other    Date Application Sent to Prescriber 03/23/2023    Name of Prescriber Yevette Hem    Date Application Received From Patient  04/19/2023   Application Items Received From Patient  Application;Proof of Income;Other   Date Application Received From Provider  04/11/2023   Date Application Submitted to Manufacturer  05/17/2023   Method Application Sent to Manufacturer  Fax   Patient Assistance Determination   Approved  Approval Start Date   06/12/2023  Approval End Date   06/06/2024  Patient Notification Method   Telephone Call  Telephone Call Outcome   Left Voicemail       03/22/2023 05/17/2023 05/22/2023  Outreach Medication Two  Initial Outreach Date (Medication Two) 03/20/2023    Manufacturer Medication Two [name]    [facility] Ozempic    Dose of Ozempic 4mg /59ml    Type of Radiographer, therapeutic Assistance    Date Application Sent to Patient 03/23/2023    Application Items Requested Application;Proof of Income;Other    Date Application Sent to Prescriber 03/23/2023    Name of Prescriber Yevette Hem    Date Application Received From Patient  04/19/2023   Application Items Received From Patient  Application;Proof of Income;Other   Date Application Received From Provider  03/24/2023   Method Application Sent to Manufacturer  Fax   Date Application Submitted to Manufacturer  05/17/2023   Patient Assistance Determination   Approved  Approval Start Date   06/12/2023   Patient Notification Method   Telephone Call  Telephone Call Outcome   Left Voicemail       03/22/2023 05/17/2023 05/22/2023  Outreach Medication Three  Initial Outreach Date (Medication Three) 03/20/2023    Manufacturer Medication Three [name]    [facility] [name]    Dose of [name] FlexTouch 200 units/ml    Type of Radiographer, therapeutic Assistance    Date Application Sent to Patient 03/23/2023    Application Items Requested Application;Proof of Income;Other    Date Application Sent to Prescriber 03/23/2023    Name of Prescriber Yevette Hem    Date Application Received From Patient  04/19/2023   Application Items Received From Patient  Application;Proof of Income;Other   Date Application Received From Provider  03/24/2023   Date Application Submitted to Manufacturer  05/16/2022   Method Application Sent to Manufacturer  Fax   Patient Assistance Determination   Approved  Approval Start Date   06/12/2023  Approval End Date   06/06/2024  Patient Notification Method   Telephone Call    Care coordination call placed to [name] in regard to Novolog, Ozempic and [name] application. Per IVR system, application was processed on 05/17/2024. Since current enrollment is still active until 06/11/23, patients enrollment will be APPROVED 06/12/2023-06/06/2024. Medication shipments will process automatically and be delivered to the prescriber's office. Patient may call [name] at any time to check on the shipments by calling [phone].  Unsuccessful outreach to patient. HIPAA compliant voicemail  left. Was calling patient to update him on his approval, refill procedure and to provide  him with phone number to call to check on shipments.  Kimble Delaurentis, [name] [place]  Office: [phone] Fax: [phone] Email: [email or web address]

## 2023-05-23 DIAGNOSIS — E11621 Type 2 diabetes mellitus with foot ulcer: Secondary | ICD-10-CM | POA: Diagnosis not present

## 2023-05-23 DIAGNOSIS — L97422 Non-pressure chronic ulcer of left heel and midfoot with fat layer exposed: Secondary | ICD-10-CM | POA: Diagnosis not present

## 2023-05-23 DIAGNOSIS — M14672 Charcot's joint, left ankle and foot: Secondary | ICD-10-CM | POA: Diagnosis not present

## 2023-05-23 DIAGNOSIS — E1143 Type 2 diabetes mellitus with diabetic autonomic (poly)neuropathy: Secondary | ICD-10-CM | POA: Diagnosis not present

## 2023-05-23 DIAGNOSIS — Z89432 Acquired absence of left foot: Secondary | ICD-10-CM | POA: Diagnosis not present

## 2023-07-14 DIAGNOSIS — I129 Hypertensive chronic kidney disease with stage 1 through stage 4 chronic kidney disease, or unspecified chronic kidney disease: Secondary | ICD-10-CM | POA: Diagnosis not present

## 2023-07-14 DIAGNOSIS — N1831 Chronic kidney disease, stage 3a: Secondary | ICD-10-CM | POA: Diagnosis not present

## 2023-07-14 DIAGNOSIS — E1122 Type 2 diabetes mellitus with diabetic chronic kidney disease: Secondary | ICD-10-CM | POA: Diagnosis not present

## 2023-07-14 DIAGNOSIS — E1129 Type 2 diabetes mellitus with other diabetic kidney complication: Secondary | ICD-10-CM | POA: Diagnosis not present

## 2023-07-14 DIAGNOSIS — E875 Hyperkalemia: Secondary | ICD-10-CM | POA: Diagnosis not present

## 2023-07-14 DIAGNOSIS — N2581 Secondary hyperparathyroidism of renal origin: Secondary | ICD-10-CM | POA: Diagnosis not present

## 2023-07-14 DIAGNOSIS — E1143 Type 2 diabetes mellitus with diabetic autonomic (poly)neuropathy: Secondary | ICD-10-CM | POA: Diagnosis not present

## 2023-07-14 DIAGNOSIS — R809 Proteinuria, unspecified: Secondary | ICD-10-CM | POA: Diagnosis not present

## 2023-08-09 DIAGNOSIS — Z89432 Acquired absence of left foot: Secondary | ICD-10-CM | POA: Diagnosis not present

## 2023-08-09 DIAGNOSIS — Z8631 Personal history of diabetic foot ulcer: Secondary | ICD-10-CM | POA: Diagnosis not present

## 2023-08-09 DIAGNOSIS — E1143 Type 2 diabetes mellitus with diabetic autonomic (poly)neuropathy: Secondary | ICD-10-CM | POA: Diagnosis not present

## 2023-08-09 DIAGNOSIS — M14672 Charcot's joint, left ankle and foot: Secondary | ICD-10-CM | POA: Diagnosis not present

## 2023-10-16 DIAGNOSIS — L97422 Non-pressure chronic ulcer of left heel and midfoot with fat layer exposed: Secondary | ICD-10-CM | POA: Diagnosis not present

## 2023-10-16 DIAGNOSIS — L089 Local infection of the skin and subcutaneous tissue, unspecified: Secondary | ICD-10-CM | POA: Diagnosis not present

## 2023-10-16 DIAGNOSIS — E11621 Type 2 diabetes mellitus with foot ulcer: Secondary | ICD-10-CM | POA: Diagnosis not present

## 2023-10-16 DIAGNOSIS — E11628 Type 2 diabetes mellitus with other skin complications: Secondary | ICD-10-CM | POA: Diagnosis not present

## 2023-11-01 DIAGNOSIS — M25371 Other instability, right ankle: Secondary | ICD-10-CM | POA: Diagnosis not present

## 2023-11-08 DIAGNOSIS — Z89432 Acquired absence of left foot: Secondary | ICD-10-CM | POA: Diagnosis not present

## 2023-11-08 DIAGNOSIS — L97422 Non-pressure chronic ulcer of left heel and midfoot with fat layer exposed: Secondary | ICD-10-CM | POA: Diagnosis not present

## 2023-11-08 DIAGNOSIS — E11621 Type 2 diabetes mellitus with foot ulcer: Secondary | ICD-10-CM | POA: Diagnosis not present

## 2023-11-08 DIAGNOSIS — E1143 Type 2 diabetes mellitus with diabetic autonomic (poly)neuropathy: Secondary | ICD-10-CM | POA: Diagnosis not present

## 2023-11-16 DIAGNOSIS — E11621 Type 2 diabetes mellitus with foot ulcer: Secondary | ICD-10-CM | POA: Diagnosis not present

## 2023-11-16 DIAGNOSIS — L97422 Non-pressure chronic ulcer of left heel and midfoot with fat layer exposed: Secondary | ICD-10-CM | POA: Diagnosis not present

## 2023-11-23 DIAGNOSIS — E11621 Type 2 diabetes mellitus with foot ulcer: Secondary | ICD-10-CM | POA: Diagnosis not present

## 2023-11-23 DIAGNOSIS — M14672 Charcot's joint, left ankle and foot: Secondary | ICD-10-CM | POA: Diagnosis not present

## 2023-11-23 DIAGNOSIS — Z89432 Acquired absence of left foot: Secondary | ICD-10-CM | POA: Diagnosis not present

## 2023-11-23 DIAGNOSIS — L97422 Non-pressure chronic ulcer of left heel and midfoot with fat layer exposed: Secondary | ICD-10-CM | POA: Diagnosis not present

## 2023-11-23 DIAGNOSIS — E1143 Type 2 diabetes mellitus with diabetic autonomic (poly)neuropathy: Secondary | ICD-10-CM | POA: Diagnosis not present

## 2023-11-29 DIAGNOSIS — Z89432 Acquired absence of left foot: Secondary | ICD-10-CM | POA: Diagnosis not present

## 2023-11-29 DIAGNOSIS — E1143 Type 2 diabetes mellitus with diabetic autonomic (poly)neuropathy: Secondary | ICD-10-CM | POA: Diagnosis not present

## 2023-11-29 DIAGNOSIS — L97422 Non-pressure chronic ulcer of left heel and midfoot with fat layer exposed: Secondary | ICD-10-CM | POA: Diagnosis not present

## 2023-11-29 DIAGNOSIS — E11621 Type 2 diabetes mellitus with foot ulcer: Secondary | ICD-10-CM | POA: Diagnosis not present

## 2023-12-06 DIAGNOSIS — E1143 Type 2 diabetes mellitus with diabetic autonomic (poly)neuropathy: Secondary | ICD-10-CM | POA: Diagnosis not present

## 2023-12-06 DIAGNOSIS — L97422 Non-pressure chronic ulcer of left heel and midfoot with fat layer exposed: Secondary | ICD-10-CM | POA: Diagnosis not present

## 2023-12-06 DIAGNOSIS — Z89432 Acquired absence of left foot: Secondary | ICD-10-CM | POA: Diagnosis not present

## 2023-12-06 DIAGNOSIS — E11621 Type 2 diabetes mellitus with foot ulcer: Secondary | ICD-10-CM | POA: Diagnosis not present

## 2023-12-13 DIAGNOSIS — E1143 Type 2 diabetes mellitus with diabetic autonomic (poly)neuropathy: Secondary | ICD-10-CM | POA: Diagnosis not present

## 2023-12-13 DIAGNOSIS — E11621 Type 2 diabetes mellitus with foot ulcer: Secondary | ICD-10-CM | POA: Diagnosis not present

## 2023-12-13 DIAGNOSIS — L97422 Non-pressure chronic ulcer of left heel and midfoot with fat layer exposed: Secondary | ICD-10-CM | POA: Diagnosis not present

## 2023-12-13 DIAGNOSIS — M14672 Charcot's joint, left ankle and foot: Secondary | ICD-10-CM | POA: Diagnosis not present

## 2023-12-13 DIAGNOSIS — Z89432 Acquired absence of left foot: Secondary | ICD-10-CM | POA: Diagnosis not present

## 2023-12-20 DIAGNOSIS — E11621 Type 2 diabetes mellitus with foot ulcer: Secondary | ICD-10-CM | POA: Diagnosis not present

## 2023-12-20 DIAGNOSIS — L97422 Non-pressure chronic ulcer of left heel and midfoot with fat layer exposed: Secondary | ICD-10-CM | POA: Diagnosis not present

## 2023-12-25 ENCOUNTER — Telehealth: Payer: Self-pay | Admitting: Pharmacist

## 2023-12-25 ENCOUNTER — Other Ambulatory Visit (HOSPITAL_COMMUNITY): Payer: Self-pay

## 2023-12-25 NOTE — Progress Notes (Signed)
   12/25/2023  Patient ID: Bettyann JONETTA Benders, male   DOB: 1970/08/10, 52 y.o.   MRN: [id number]  Telephonic engagement with Mr. Dianne Bady today regarding reenrollment with patient assistance programs [name], [employer]&Me, and [name].   [name]: Novolog Inject 5-8 units subcutaneous TID with SSI max of 45 units daily [name] U-200 34 units subcutaneous daily, Ozempic 2 mg subcutaneous weekly  Discussed with [employer] , will complete electronic renewal in February 2026 as enrollment expires 06/2024.   AZ&Me: Farxiga 10 mg 1 tablet PO daily   [name]: Kerendia 20 mg 1 tablet PO daily  Will forward to [employer], [name] for coordination of applications. Patient would like to stop by office and sign this week.   Annabella Galla, PharmD Clinical Pharmacist [place] Direct Dial: [phone]

## 2023-12-27 DIAGNOSIS — E11621 Type 2 diabetes mellitus with foot ulcer: Secondary | ICD-10-CM | POA: Diagnosis not present

## 2023-12-27 DIAGNOSIS — L97529 Non-pressure chronic ulcer of other part of left foot with unspecified severity: Secondary | ICD-10-CM | POA: Diagnosis not present

## 2024-01-09 DIAGNOSIS — Z89432 Acquired absence of left foot: Secondary | ICD-10-CM | POA: Diagnosis not present

## 2024-01-09 DIAGNOSIS — L97422 Non-pressure chronic ulcer of left heel and midfoot with fat layer exposed: Secondary | ICD-10-CM | POA: Diagnosis not present

## 2024-01-09 DIAGNOSIS — M14672 Charcot's joint, left ankle and foot: Secondary | ICD-10-CM | POA: Diagnosis not present

## 2024-01-09 DIAGNOSIS — E11621 Type 2 diabetes mellitus with foot ulcer: Secondary | ICD-10-CM | POA: Diagnosis not present

## 2024-01-09 DIAGNOSIS — E1143 Type 2 diabetes mellitus with diabetic autonomic (poly)neuropathy: Secondary | ICD-10-CM | POA: Diagnosis not present

## 2024-01-12 ENCOUNTER — Telehealth: Payer: Self-pay

## 2024-01-12 NOTE — Telephone Encounter (Signed)
 2026 Renewal  Received completed patient assistance application for Kerendia ([name]) and Farxiga (AZ&ME) from [facility]  Faxed completed provider forms to Dr. Italy Nabors at [facility] to be reviewed and signed

## 2024-01-17 NOTE — Telephone Encounter (Signed)
 Received provider portion of 2026 patient assistance for Kerendia and Farxiga

## 2024-01-24 NOTE — Telephone Encounter (Signed)
 PAP: Application for Leonore has been submitted to [name], via fax  PAP: Application for Doreen has been submitted to [employer] ([employer]Me), via fax

## 2024-01-25 ENCOUNTER — Other Ambulatory Visit (HOSPITAL_COMMUNITY): Payer: Self-pay

## 2024-01-25 DIAGNOSIS — E1165 Type 2 diabetes mellitus with hyperglycemia: Secondary | ICD-10-CM | POA: Diagnosis not present

## 2024-01-25 DIAGNOSIS — N183 Chronic kidney disease, stage 3 unspecified: Secondary | ICD-10-CM | POA: Diagnosis not present

## 2024-01-26 NOTE — Telephone Encounter (Signed)
 PAP: Patient assistance application for Farxiga has been approved by PAP Companies: [employer] from 04/11/2024 to 04/10/2025. Medication should be delivered to PAP Delivery: Home. For further shipping updates, please contact [employer] (AZ[employer]Me) at [phone]. Patient ID is: [id number]

## 2024-02-07 ENCOUNTER — Telehealth: Payer: Self-pay

## 2024-02-07 NOTE — Telephone Encounter (Signed)
 Completed reorder form for [name] and faxed to Dr. Chad Nabors at [facility] to review and Sign

## 2024-02-12 NOTE — Telephone Encounter (Signed)
 Faxed completed and signed Ozempic reorder form to [name] for review

## 2024-02-13 DIAGNOSIS — E1165 Type 2 diabetes mellitus with hyperglycemia: Secondary | ICD-10-CM | POA: Diagnosis not present

## 2024-02-13 DIAGNOSIS — Z6837 Body mass index (BMI) 37.0-37.9, adult: Secondary | ICD-10-CM | POA: Diagnosis not present

## 2024-02-13 DIAGNOSIS — E1140 Type 2 diabetes mellitus with diabetic neuropathy, unspecified: Secondary | ICD-10-CM | POA: Diagnosis not present

## 2024-02-13 DIAGNOSIS — N183 Chronic kidney disease, stage 3 unspecified: Secondary | ICD-10-CM | POA: Diagnosis not present

## 2024-02-13 DIAGNOSIS — E11610 Type 2 diabetes mellitus with diabetic neuropathic arthropathy: Secondary | ICD-10-CM | POA: Diagnosis not present

## 2024-04-02 NOTE — Telephone Encounter (Signed)
 PAP: Patient assistance application for Kerendia has been approved by PAP Companies: [name] from 04/11/2024 to 01/19/2025. Medication should be delivered to PAP Delivery: Home. For further shipping updates, please contact [name] [phone]. Patient ID is: no ID given, per rep
